# Patient Record
Sex: Female | Born: 1985 | Race: White | Hispanic: No | Marital: Married | Smoking: Current every day smoker
Health system: Southern US, Community
[De-identification: ages and names within clinical notes are randomized; demographics above are authoritative.]

## PROBLEM LIST (undated history)

## (undated) DIAGNOSIS — O26649 Intrahepatic cholestasis of pregnancy, unspecified trimester: Secondary | ICD-10-CM

## (undated) DIAGNOSIS — R87629 Unspecified abnormal cytological findings in specimens from vagina: Secondary | ICD-10-CM

## (undated) DIAGNOSIS — O26619 Liver and biliary tract disorders in pregnancy, unspecified trimester: Secondary | ICD-10-CM

## (undated) DIAGNOSIS — F988 Other specified behavioral and emotional disorders with onset usually occurring in childhood and adolescence: Secondary | ICD-10-CM

## (undated) DIAGNOSIS — K831 Obstruction of bile duct: Secondary | ICD-10-CM

## (undated) DIAGNOSIS — F32A Depression, unspecified: Secondary | ICD-10-CM

## (undated) DIAGNOSIS — F329 Major depressive disorder, single episode, unspecified: Secondary | ICD-10-CM

## (undated) HISTORY — DX: Intrahepatic cholestasis of pregnancy, unspecified trimester: O26.649

## (undated) HISTORY — PX: WISDOM TOOTH EXTRACTION: SHX21

## (undated) HISTORY — DX: Obstruction of bile duct: K83.1

## (undated) HISTORY — DX: Unspecified abnormal cytological findings in specimens from vagina: R87.629

## (undated) HISTORY — DX: Depression, unspecified: F32.A

## (undated) HISTORY — DX: Other specified behavioral and emotional disorders with onset usually occurring in childhood and adolescence: F98.8

## (undated) HISTORY — DX: Major depressive disorder, single episode, unspecified: F32.9

## (undated) HISTORY — DX: Liver and biliary tract disorders in pregnancy, unspecified trimester: O26.619

---

## 2009-12-08 ENCOUNTER — Ambulatory Visit: Payer: Self-pay | Admitting: Internal Medicine

## 2015-01-10 NOTE — L&D Delivery Note (Signed)
Delivery Note At 5:11 AM a viable female was delivered via  (Presentation:DOA ;  ).  APGAR: 3, 7; weight  .  pending Placenta status: ,intact, spontaneous .  Cord: 3VC with the following complications: .  Cord pH: 7.04  Over the last 15 minutes deep variable decels to 80's, lasting up to 40 seconds with contractions. Loss of variability but recovery to baseline between pushing. Head at perineum and continued progress with each push. Given imminent delivery, pt allowed continued pushing attempts.   Anesthesia:  epidural Episiotomy:  none Lacerations: 2nd degree;Perineal Suture Repair: 3.0 vicryl rapide small vaginal laceration, distal to hymenal ring, repaired with single figure of 8 suture Est. Blood Loss (mL):  1200  Placenta delivered within minutes of baby. Code Apgar called and NICU in to evaluated baby.   After placental delivery, brisk bleeding noted. Laceration small and not bleeding after repair. Pitocin started just prior to placental delivery. Fundal massage productive of clots. Uterus palpated firm and below umbilicus but continued bleeding. Cervix evaluated, intact, no lacerations. Placenta evaluated- intact. Methergine given, continued bleeding. Red rubber catheter to drain bladder- 50 cc return.  Manual evaluation of uterus- sterile gloved hand placed into uterus, no significant clot, adequate tone noted. 3g Unasyn ordered. 800 mcg cytotec placed- continued bleeding, 0.25mg  Hemabate IM given. Code hemorrhage called. Labs drawn, 2nd IV started.  Pt would have several minute periods of no bleeding followed by short episodes of brisk bleeding- between different medication administrations. After about 40 minutes bleeding finally slowed to a normal PP trickle. 40 minutes spent post-delivery in critical care time managing PP hemorrhage.   Mom to postpartum.  Baby to Couplet care / Skin to Skin.  Annette Lawson A. 12/01/2015 5:56 AM

## 2015-02-22 ENCOUNTER — Other Ambulatory Visit: Payer: Self-pay

## 2015-02-22 ENCOUNTER — Ambulatory Visit (INDEPENDENT_AMBULATORY_CARE_PROVIDER_SITE_OTHER): Payer: BC Managed Care – PPO | Admitting: Gastroenterology

## 2015-02-22 ENCOUNTER — Encounter: Payer: Self-pay | Admitting: Gastroenterology

## 2015-02-22 VITALS — BP 116/67 | HR 77 | Temp 98.2°F | Ht 66.0 in | Wt 145.0 lb

## 2015-02-22 DIAGNOSIS — F988 Other specified behavioral and emotional disorders with onset usually occurring in childhood and adolescence: Secondary | ICD-10-CM | POA: Insufficient documentation

## 2015-02-22 DIAGNOSIS — R1084 Generalized abdominal pain: Secondary | ICD-10-CM | POA: Diagnosis not present

## 2015-02-22 MED ORDER — HYOSCYAMINE SULFATE 0.125 MG SL SUBL: 0.1250 mg | SUBLINGUAL_TABLET | SUBLINGUAL | Status: DC | PRN

## 2015-02-22 NOTE — Progress Notes (Signed)
Gastroenterology Consultation  Referring Provider:     No ref. provider found Primary Care Physician:  No PCP Per Patient Primary Gastroenterologist:  Dr. Allen Norris     Reason for Consultation:     Abdominal pain        HPI:   Annette Lawson is a 30 y.o. y/o female referred for consultation & management of abdominal pain by Dr. Rayne Du PCP Per Patient.  This patient comes in today with a report of attacks that started as burning in her epigastric area that radiates down to her abdomen and around her flanks. She reports that it feels like there is 100 pounds trying to get out of her abdomen. She was seen by OB who reported no source of her pain being seen. The patient states that the pain has got so bad that she has gone to urgent care with a negative workup. The patient also reports that she had some nausea and vomiting with the pain but thinks it was not due to the cause but as a reaction to the pain. The patient has had 2 of the attacks after eating brisket. She has not noticed any other foods or inciting factors. The patient does report that she has tried anti-inflammatories and Tums without any success. She does report that when she takes Tylenol decreases the pain enough for her to fall asleep. Is no report of any black stools or bloody stools. She also denies any diarrhea or constipation. The attacks happen only a few times a year.  Past Medical History  Diagnosis Date  . ADD (attention deficit disorder)     Past Surgical History  Procedure Laterality Date  . No surgical hx      Prior to Admission medications   Medication Sig Start Date End Date Taking? Authorizing Provider  amphetamine-dextroamphetamine (ADDERALL) 10 MG tablet Take by mouth.   Yes Historical Provider, MD  nicotine (RA NICOTINE) 7 mg/24hr patch Place onto the skin.   Yes Historical Provider, MD  hyoscyamine (LEVSIN/SL) 0.125 MG SL tablet Place 1 tablet (0.125 mg total) under the tongue every 4 (four) hours as needed.  02/22/15   Lucilla Lame, MD  Florin 28 0.25-35 MG-MCG tablet Reported on 02/22/2015 01/07/15   Historical Provider, MD    Family History  Problem Relation Age of Onset  . Hypertension Maternal Grandmother      Social History  Substance Use Topics  . Smoking status: Current Some Day Smoker  . Smokeless tobacco: Never Used  . Alcohol Use: 0.0 oz/week    0 Standard drinks or equivalent per week     Comment: Occasional    Allergies as of 02/22/2015  . (No Known Allergies)    Review of Systems:    All systems reviewed and negative except where noted in HPI.   Physical Exam:  BP 116/67 mmHg  Pulse 77  Temp(Src) 98.2 F (36.8 C) (Oral)  Ht 5\' 6"  (1.676 m)  Wt 145 lb (65.772 kg)  BMI 23.41 kg/m2 No LMP recorded. Psych:  Alert and cooperative. Normal mood and affect. General:   Alert,  Well-developed, well-nourished, pleasant and cooperative in NAD Head:  Normocephalic and atraumatic. Eyes:  Sclera clear, no icterus.   Conjunctiva pink. Ears:  Normal auditory acuity. Nose:  No deformity, discharge, or lesions. Mouth:  No deformity or lesions,oropharynx pink & moist. Neck:  Supple; no masses or thyromegaly. Lungs:  Respirations even and unlabored.  Clear throughout to auscultation.   No wheezes, crackles, or rhonchi.  No acute distress. Heart:  Regular rate and rhythm; no murmurs, clicks, rubs, or gallops. Abdomen:  Normal bowel sounds.  No bruits.  Soft, non-tender and non-distended without masses, hepatosplenomegaly or hernias noted.  No guarding or rebound tenderness.  Negative Carnett sign.   Rectal:  Deferred.  Msk:  Symmetrical without gross deformities.  Good, equal movement & strength bilaterally. Pulses:  Normal pulses noted. Extremities:  No clubbing or edema.  No cyanosis. Neurologic:  Alert and oriented x3;  grossly normal neurologically. Skin:  Intact without significant lesions or rashes.  No jaundice. Lymph Nodes:  No significant cervical adenopathy. Psych:   Alert and cooperative. Normal mood and affect.  Imaging Studies: No results found.  Assessment and Plan:   Annette Lawson is a 30 y.o. y/o female comes in today with a history of attacks of abdominal pain that started in the epigastric area with burning which is not relieved by antacids or anti-inflammatories. The pain then radiates down to the entire upper abdomen and flanks. The patient reports the pain to have been so bad that she went to urgent care. The pain lasts approximately 1-1/2 days. The patient will be given samples of hyoscyamine to be taken sublingually whenever she has the pain to see if the symptoms improve. If they do not the patient may need a CT scan of the abdomen. She will also have a requisition for a lipase to be taken to the lab when she has the abdominal pain to rule out possible acute pancreatitis. The patient has been explained the plan and agrees with it.   Note: This dictation was prepared with Dragon dictation along with smaller phrase technology. Any transcriptional errors that result from this process are unintentional.

## 2015-05-19 LAB — OB RESULTS CONSOLE RPR: RPR: NONREACTIVE

## 2015-05-19 LAB — OB RESULTS CONSOLE ANTIBODY SCREEN: ANTIBODY SCREEN: NEGATIVE

## 2015-05-19 LAB — OB RESULTS CONSOLE RUBELLA ANTIBODY, IGM: Rubella: IMMUNE

## 2015-05-19 LAB — OB RESULTS CONSOLE ABO/RH: RH TYPE: NEGATIVE

## 2015-05-19 LAB — OB RESULTS CONSOLE HIV ANTIBODY (ROUTINE TESTING): HIV: NONREACTIVE

## 2015-05-19 LAB — OB RESULTS CONSOLE HEPATITIS B SURFACE ANTIGEN: Hepatitis B Surface Ag: NEGATIVE

## 2015-05-19 LAB — OB RESULTS CONSOLE GC/CHLAMYDIA
Chlamydia: NEGATIVE
GC PROBE AMP, GENITAL: NEGATIVE

## 2015-11-17 LAB — OB RESULTS CONSOLE GBS: STREP GROUP B AG: POSITIVE

## 2015-11-23 ENCOUNTER — Telehealth (HOSPITAL_COMMUNITY): Payer: Self-pay | Admitting: *Deleted

## 2015-11-23 ENCOUNTER — Encounter (HOSPITAL_COMMUNITY): Payer: Self-pay | Admitting: *Deleted

## 2015-11-23 NOTE — Telephone Encounter (Signed)
Preadmission screen  

## 2015-11-25 ENCOUNTER — Other Ambulatory Visit: Payer: Self-pay | Admitting: Obstetrics & Gynecology

## 2015-11-30 ENCOUNTER — Inpatient Hospital Stay (HOSPITAL_COMMUNITY): Payer: BC Managed Care – PPO | Admitting: Anesthesiology

## 2015-11-30 ENCOUNTER — Encounter (HOSPITAL_COMMUNITY): Payer: Self-pay

## 2015-11-30 ENCOUNTER — Inpatient Hospital Stay (HOSPITAL_COMMUNITY)
Admission: RE | Admit: 2015-11-30 | Discharge: 2015-12-03 | DRG: 774 | Disposition: A | Discharge status: Home / Self Care | Payer: BC Managed Care – PPO | Source: Ambulatory Visit | Attending: Obstetrics & Gynecology | Admitting: Obstetrics & Gynecology

## 2015-11-30 DIAGNOSIS — O9081 Anemia of the puerperium: Secondary | ICD-10-CM | POA: Diagnosis not present

## 2015-11-30 DIAGNOSIS — O2662 Liver and biliary tract disorders in childbirth: Secondary | ICD-10-CM | POA: Diagnosis present

## 2015-11-30 DIAGNOSIS — O99334 Smoking (tobacco) complicating childbirth: Secondary | ICD-10-CM | POA: Diagnosis present

## 2015-11-30 DIAGNOSIS — D62 Acute posthemorrhagic anemia: Secondary | ICD-10-CM | POA: Diagnosis not present

## 2015-11-30 DIAGNOSIS — F17200 Nicotine dependence, unspecified, uncomplicated: Secondary | ICD-10-CM | POA: Diagnosis present

## 2015-11-30 DIAGNOSIS — F329 Major depressive disorder, single episode, unspecified: Secondary | ICD-10-CM | POA: Diagnosis present

## 2015-11-30 DIAGNOSIS — O99824 Streptococcus B carrier state complicating childbirth: Secondary | ICD-10-CM | POA: Diagnosis present

## 2015-11-30 DIAGNOSIS — K831 Obstruction of bile duct: Secondary | ICD-10-CM | POA: Diagnosis present

## 2015-11-30 DIAGNOSIS — O99344 Other mental disorders complicating childbirth: Secondary | ICD-10-CM | POA: Diagnosis present

## 2015-11-30 DIAGNOSIS — Z3A37 37 weeks gestation of pregnancy: Secondary | ICD-10-CM

## 2015-11-30 LAB — CBC
HCT: 31.6 % — ABNORMAL LOW (ref 36.0–46.0)
HCT: 29.8 % — ABNORMAL LOW (ref 36.0–46.0)
HEMOGLOBIN: 10.5 g/dL — AB (ref 12.0–15.0)
Hemoglobin: 10 g/dL — ABNORMAL LOW (ref 12.0–15.0)
MCH: 27.5 pg (ref 26.0–34.0)
MCH: 28.2 pg (ref 26.0–34.0)
MCHC: 33.2 g/dL (ref 30.0–36.0)
MCHC: 33.6 g/dL (ref 30.0–36.0)
MCV: 82.7 fL (ref 78.0–100.0)
MCV: 83.9 fL (ref 78.0–100.0)
PLATELETS: 310 10*3/uL (ref 150–400)
Platelets: 347 10*3/uL (ref 150–400)
RBC: 3.82 MIL/uL — AB (ref 3.87–5.11)
RBC: 3.55 MIL/uL — ABNORMAL LOW (ref 3.87–5.11)
RDW: 14.1 % (ref 11.5–15.5)
RDW: 14.1 % (ref 11.5–15.5)
WBC: 10.7 10*3/uL — ABNORMAL HIGH (ref 4.0–10.5)
WBC: 14.2 10*3/uL — AB (ref 4.0–10.5)

## 2015-11-30 LAB — COMPREHENSIVE METABOLIC PANEL
ALBUMIN: 2.9 g/dL — AB (ref 3.5–5.0)
ALT: 14 U/L (ref 14–54)
ANION GAP: 9 (ref 5–15)
AST: 24 U/L (ref 15–41)
Alkaline Phosphatase: 195 U/L — ABNORMAL HIGH (ref 38–126)
BILIRUBIN TOTAL: 0.4 mg/dL (ref 0.3–1.2)
BUN: 9 mg/dL (ref 6–20)
CHLORIDE: 104 mmol/L (ref 101–111)
CO2: 21 mmol/L — ABNORMAL LOW (ref 22–32)
Calcium: 9.4 mg/dL (ref 8.9–10.3)
Creatinine, Ser: 0.64 mg/dL (ref 0.44–1.00)
GFR calc Af Amer: >60 mL/min (ref >60)
GLUCOSE: 85 mg/dL (ref 65–99)
POTASSIUM: 4 mmol/L (ref 3.5–5.1)
Sodium: 134 mmol/L — ABNORMAL LOW (ref 135–145)
TOTAL PROTEIN: 6.2 g/dL — AB (ref 6.5–8.1)

## 2015-11-30 LAB — RPR: RPR Ser Ql: NONREACTIVE

## 2015-11-30 MED ORDER — PHENYLEPHRINE 40 MCG/ML (10ML) SYRINGE FOR IV PUSH (FOR BLOOD PRESSURE SUPPORT)
80.0000 ug | PREFILLED_SYRINGE | INTRAVENOUS | Status: DC | PRN
  Filled 2015-11-30: qty 10
  Filled 2015-11-30: qty 5

## 2015-11-30 MED ORDER — LACTATED RINGERS IV SOLN: 500.0000 mL | Freq: Once | INTRAVENOUS | Status: DC

## 2015-11-30 MED ORDER — SOD CITRATE-CITRIC ACID 500-334 MG/5ML PO SOLN
30.0000 mL | ORAL | Status: DC | PRN
  Filled 2015-11-30: qty 15

## 2015-11-30 MED ORDER — LACTATED RINGERS IV SOLN: 500.0000 mL | INTRAVENOUS | Status: DC | PRN

## 2015-11-30 MED ORDER — PHENYLEPHRINE 40 MCG/ML (10ML) SYRINGE FOR IV PUSH (FOR BLOOD PRESSURE SUPPORT)
80.0000 ug | PREFILLED_SYRINGE | INTRAVENOUS | Status: DC | PRN
  Filled 2015-11-30: qty 5

## 2015-11-30 MED ORDER — EPHEDRINE 5 MG/ML INJ
10.0000 mg | INTRAVENOUS | Status: DC | PRN
  Filled 2015-11-30: qty 4

## 2015-11-30 MED ORDER — ONDANSETRON HCL 4 MG/2ML IJ SOLN: 4.0000 mg | Freq: Four times a day (QID) | INTRAMUSCULAR | Status: DC | PRN

## 2015-11-30 MED ORDER — DEXTROSE 5 % IV SOLN
2.5000 10*6.[IU] | INTRAVENOUS | Status: DC
  Filled 2015-11-30: qty 2.5

## 2015-11-30 MED ORDER — FENTANYL 2.5 MCG/ML BUPIVACAINE 1/10 % EPIDURAL INFUSION (WH - ANES)
14.0000 mL/h | INTRAMUSCULAR | Status: DC | PRN
  Administered 2015-11-30: 14 mL/h via EPIDURAL
  Filled 2015-11-30 (×2): qty 100

## 2015-11-30 MED ORDER — TERBUTALINE SULFATE 1 MG/ML IJ SOLN
0.2500 mg | Freq: Once | INTRAMUSCULAR | Status: DC | PRN
  Filled 2015-11-30: qty 1

## 2015-11-30 MED ORDER — PENICILLIN G POT IN DEXTROSE 60000 UNIT/ML IV SOLN
3.0000 10*6.[IU] | INTRAVENOUS | Status: DC
  Administered 2015-11-30 – 2015-12-01 (×4): 3 10*6.[IU] via INTRAVENOUS
  Filled 2015-11-30 (×7): qty 50

## 2015-11-30 MED ORDER — EPHEDRINE 5 MG/ML INJ: 10.0000 mg | INTRAVENOUS | Status: DC | PRN

## 2015-11-30 MED ORDER — PHENYLEPHRINE 40 MCG/ML (10ML) SYRINGE FOR IV PUSH (FOR BLOOD PRESSURE SUPPORT): 80.0000 ug | PREFILLED_SYRINGE | INTRAVENOUS | Status: DC | PRN

## 2015-11-30 MED ORDER — LACTATED RINGERS IV SOLN
INTRAVENOUS | Status: DC
  Administered 2015-11-30 (×2): via INTRAVENOUS

## 2015-11-30 MED ORDER — OXYTOCIN BOLUS FROM INFUSION: 500.0000 mL | Freq: Once | INTRAVENOUS | Status: DC

## 2015-11-30 MED ORDER — LIDOCAINE HCL (PF) 1 % IJ SOLN
30.0000 mL | INTRAMUSCULAR | Status: AC | PRN
Start: 2015-11-30 — End: 2015-12-03
  Administered 2015-12-01: 30 mL via SUBCUTANEOUS
  Filled 2015-11-30: qty 30

## 2015-11-30 MED ORDER — OXYTOCIN 40 UNITS IN LACTATED RINGERS INFUSION - SIMPLE MED
1.0000 m[IU]/min | INTRAVENOUS | Status: DC
  Administered 2015-11-30: 2 m[IU]/min via INTRAVENOUS

## 2015-11-30 MED ORDER — BUTORPHANOL TARTRATE 1 MG/ML IJ SOLN
1.0000 mg | INTRAMUSCULAR | Status: DC | PRN
  Administered 2015-11-30: 1 mg via INTRAVENOUS
  Filled 2015-11-30: qty 1

## 2015-11-30 MED ORDER — PENICILLIN G POTASSIUM 5000000 UNITS IJ SOLR
5.0000 10*6.[IU] | Freq: Once | INTRAVENOUS | Status: AC
  Administered 2015-11-30: 5 10*6.[IU] via INTRAVENOUS
  Filled 2015-11-30: qty 5

## 2015-11-30 MED ORDER — OXYTOCIN 40 UNITS IN LACTATED RINGERS INFUSION - SIMPLE MED
2.5000 [IU]/h | INTRAVENOUS | Status: DC
  Filled 2015-11-30: qty 1000

## 2015-11-30 MED ORDER — LIDOCAINE HCL (PF) 1 % IJ SOLN
INTRAMUSCULAR | Status: DC | PRN
  Administered 2015-11-30: 4 mL via EPIDURAL

## 2015-11-30 MED ORDER — DIPHENHYDRAMINE HCL 50 MG/ML IJ SOLN: 12.5000 mg | INTRAMUSCULAR | Status: DC | PRN

## 2015-11-30 MED ORDER — OXYCODONE-ACETAMINOPHEN 5-325 MG PO TABS: 1.0000 | ORAL_TABLET | ORAL | Status: DC | PRN

## 2015-11-30 MED ORDER — ACETAMINOPHEN 325 MG PO TABS
650.0000 mg | ORAL_TABLET | ORAL | Status: DC | PRN
  Administered 2015-11-30: 650 mg via ORAL
  Filled 2015-11-30: qty 2

## 2015-11-30 MED ORDER — OXYCODONE-ACETAMINOPHEN 5-325 MG PO TABS: 2.0000 | ORAL_TABLET | ORAL | Status: DC | PRN

## 2015-11-30 MED ORDER — FLEET ENEMA 7-19 GM/118ML RE ENEM: 1.0000 | ENEMA | RECTAL | Status: DC | PRN

## 2015-11-30 NOTE — Progress Notes (Signed)
Dr Pamala Hurry attempted foley bulb placement.  Placement successful with 52F foley and 6+0cc balloon.  300cc blood loss noted from foley after insertion. Dr Pamala Hurry remained at bedside and foley immediately removed with resolution of bleeding with removal

## 2015-11-30 NOTE — Anesthesia Preprocedure Evaluation (Signed)
Anesthesia Evaluation  Patient identified by MRN, date of birth, ID band Patient awake    Reviewed: Allergy & Precautions, Patient's Chart, lab work & pertinent test results  Airway Mallampati: II       Dental  (+) Teeth Intact   Pulmonary Current Smoker,    breath sounds clear to auscultation       Cardiovascular negative cardio ROS   Rhythm:Regular Rate:Normal     Neuro/Psych PSYCHIATRIC DISORDERS Depression negative neurological ROS     GI/Hepatic negative GI ROS, Neg liver ROS,   Endo/Other  negative endocrine ROS  Renal/GU negative Renal ROS  negative genitourinary   Musculoskeletal negative musculoskeletal ROS (+)   Abdominal   Peds negative pediatric ROS (+)  Hematology negative hematology ROS (+)   Anesthesia Other Findings   Reproductive/Obstetrics (+) Pregnancy                             Lab Results  Component Value Date   WBC 14.2 (H) 11/30/2015   HGB 10.0 (L) 11/30/2015   HCT 29.8 (L) 11/30/2015   MCV 83.9 11/30/2015   PLT 310 11/30/2015   No results found for: INR, PROTIME   Anesthesia Physical Anesthesia Plan  ASA: II  Anesthesia Plan: Epidural   Post-op Pain Management:    Induction:   Airway Management Planned:   Additional Equipment:   Intra-op Plan:   Post-operative Plan:   Informed Consent: I have reviewed the patients History and Physical, chart, labs and discussed the procedure including the risks, benefits and alternatives for the proposed anesthesia with the patient or authorized representative who has indicated his/her understanding and acceptance.     Plan Discussed with:   Anesthesia Plan Comments:         Anesthesia Quick Evaluation

## 2015-11-30 NOTE — H&P (Signed)
Annette Lawson is a 30 y.o. G1P0 at [redacted]w[redacted]d presenting for IOL due to cholestasis of pregnancy. Pt notes no contractions.  . Good fetal movement, No vaginal bleeding, not leaking fluid. Pt notes itching persists despite ursodiol. Occasional HA (h/o chronic HA), no scotomata, no RUQ pain.   [facility] at [place] since 9 wks - Dated by LMP c/w 9 wk u/s - O neg, s/p Rhogam - condyloma excised during pregnancy - Depression, started on Zoloft, 50mg  at 25 wks - Anemia - GBS positive - cholestasis, diagnosed at 35 wks after w/u for itching revealed bile acid salts of 15, no evidence PEC   Prenatal Transfer Tool  Maternal Diabetes: No Genetic Screening: Normal Maternal Ultrasounds/Referrals: Normal Fetal Ultrasounds or other Referrals:  None Maternal Substance Abuse:  No Significant Maternal Medications:  None Significant Maternal Lab Results: None     OB History    Gravida Para Term Preterm AB Living   1             SAB TAB Ectopic Multiple Live Births                 Past Medical History:  Diagnosis Date  . ADD (attention deficit disorder)   . Cholestasis during pregnancy   . Depression   . Vaginal Pap smear, abnormal    Past Surgical History:  Procedure Laterality Date  . No surgical hx    . WISDOM TOOTH EXTRACTION     Family History: family history includes COPD in her maternal grandmother. Social History:  reports that she has been smoking.  She has never used smokeless tobacco. She reports that she drinks alcohol. She reports that she does not use drugs.  Review of Systems - Negative except itcing   Dilation: 1.5 Effacement (%): 80 Station: -2 Exam by:: sowder Blood pressure 140/83, pulse 73, temperature 97.6 F (36.4 C), temperature source Oral, resp. rate 20, height 5\' 6"  (1.676 m), weight 94.3 kg (208 lb).  Physical Exam:  Gen: well appearing, no distress Back: no CVAT Abd: gravid, NT, no RUQ pain LE: no edema, equal bilaterally, non-tender Toco:  irritability/ q 2 min, pitocin at 14 munits/ min FH: baseline 150s, accelerations present, no deceleratons, 10 beat variability  Prenatal labs: ABO, Rh: --/--/O NEG (11/21 0750) Antibody: POS (11/21 0750) Rubella: !Error!  Immune RPR: Nonreactive (05/10 0000)  HBsAg: Negative (05/10 0000)  HIV: Non-reactive (05/10 0000)  GBS: Positive (11/08 0000)  1 hr Glucola 113  Genetic screening nl NT, nl AFP Anatomy US normal  CBC    Component Value Date/Time   WBC 10.7 (H) 11/30/2015 0750   RBC 3.82 (L) 11/30/2015 0750   HGB 10.5 (L) 11/30/2015 0750   HCT 31.6 (L) 11/30/2015 0750   PLT 347 11/30/2015 0750   MCV 82.7 11/30/2015 0750   MCH 27.5 11/30/2015 0750   MCHC 33.2 11/30/2015 0750   RDW 14.1 11/30/2015 0750   CMP     Component Value Date/Time   NA 134 (L) 11/30/2015 0750   K 4.0 11/30/2015 0750   CL 104 11/30/2015 0750   CO2 21 (L) 11/30/2015 0750   GLUCOSE 85 11/30/2015 0750   BUN 9 11/30/2015 0750   CREATININE 0.64 11/30/2015 0750   CALCIUM 9.4 11/30/2015 0750   PROT 6.2 (L) 11/30/2015 0750   ALBUMIN 2.9 (L) 11/30/2015 0750   AST 24 11/30/2015 0750   ALT 14 11/30/2015 0750   ALKPHOS 195 (H) 11/30/2015 0750   BILITOT 0.4 11/30/2015 0750  GFRNONAA >60 11/30/2015 0750   GFRAA >60 11/30/2015 0750      Assessment/Plan: 30 y.o. G1P0 at [redacted]w[redacted]d IOL for cholestasis of pregancy, no evidence PEC - GBS pos,. Plan PCN - Depression, continue Zoloft - O neg, plan Rhogam PP - Anemia.   - reactive   Fetal testing - planning epidural - IOL. Pt has been on pitocin since am, no significant cervical change. Decision made to proceed with foley bulb placement.  Foley bulb: cervical exam done, 1cm, 50%, mid/ medium. Decision to place foley and indications R/B d/w pt. Pt placed in stirrups. Sterile speculum placed into vagina. Betadine x 3 to wipe cvx and vagina. Foley balloon easily threaded through cervix just past internal os, balloon filled with 300cc NS. Brisk vaginal bleeding  noted- 300cc bright red blood coming through foley port. Reactive fetal monitoring, pt w/o pain. Placement checked. Continued bleeding, Decision made to remove foley bulb, bleeding stopped. Vertex palpated, membranes intact. D/w pt more than expected bleeding with foley placement, unclear etiology. Continued attempt at IOL and fetal monitoring.   Annette Lawson A. 11/30/2015, 3:27 PM

## 2015-11-30 NOTE — Anesthesia Pain Management Evaluation Note (Signed)
  CRNA Pain Management Visit Note  Patient: Annette Lawson, 30 y.o., female  "Hello I am a member of the anesthesia team at [facility]. We have an anesthesia team available at all times to provide care throughout the hospital, including epidural management and anesthesia for C-section. I don't know your plan for the delivery whether it a natural birth, water birth, IV sedation, nitrous supplementation, doula or epidural, but we want to meet your pain goals."   1.Was your pain managed to your expectations on prior hospitalizations?   No prior hospitalizations  2.What is your expectation for pain management during this hospitalization?     Epidural  3.How can we help you reach that goal? Epidural before it gets too late.  Record the patient's initial score and the patient's pain goal.   Pain: 0  Pain Goal: 7 [facility] wants you to be able to say your pain was always managed very well.  Hodari Chuba 11/30/2015

## 2015-11-30 NOTE — Progress Notes (Signed)
S: Doing well, no complaints, pain well controlled with epidural. Just placed now as contraction pain worsenign despite IV pain meds.   O: BP 112/70   Pulse 86   Temp 98.3 F (36.8 C) (Oral)   Resp 17   Ht 5\' 6"  (1.676 m)   Wt 94.3 kg (208 lb)   BMI 33.57 kg/m    FHT:  FHR: 140s bpm, variability: moderate,  accelerations:  Present,  decelerations:  Absent UC:   regular, every 3 minutes SVE:   Dilation: 1.5 Effacement (%): 50 Station: -2 Exam by:: Merton Border, RN Recheck by me: 3/80%/ mid position/ soft consistency/ vt x-2. Cervical scar released with digital exam  A / P:  30 y.o.  Obstetric History   G1   P0   T0   P0   A0   L0    SAB0   TAB0   Ectopic0   Multiple0   Live Births0    at [redacted]w[redacted]d IOL cholestasis of pregnancy, slow progress, just now getting active. IUPC placed to better titrate pitocin  Fetal Wellbeing:  Category I Pain Control:  Epidural  Anticipated MOD:  NSVD  Frimy Uffelman A. 11/30/2015, 11:01 PM

## 2015-11-30 NOTE — Progress Notes (Signed)
S: Doing well, no complaints, pain well controlled as only feeling cramping. No further bleeding.   O: BP 139/79   Pulse 71   Temp 97.8 F (36.6 C) (Oral)   Resp 20   Ht 5\' 6"  (1.676 m)   Wt 94.3 kg (208 lb)   BMI 33.57 kg/m    FHT:  FHR: 140s bpm, variability: moderate,  accelerations:  Present,  decelerations:  Absent UC:   regular, every 3 minutes- though pt not uncomfortble, pitocin at 14 munits/ min SVE:   Dilation: 1.5 Effacement (%): 50 Station: -2 Exam by:: Nissi Doffing  AROM- bloody fluid    A / P:  30 y.o.  Obstetric History   G1   P0   T0   P0   A0   L0    SAB0   TAB0   Ectopic0   Multiple0   Live Births0    at [redacted]w[redacted]d IOL due to cholestasis of pregnancy, slow progress, not yet in active labor, pitocin has not been increased for many hours due to irritability. Encouraged nurse to increase pitocin as fetal testing reactive. AROM now.   Fetal Wellbeing:  Category I Pain Control:  Labor support without medications  Anticipated MOD:  NSVD though needs to get into active labor.   Annette Lawson A. 11/30/2015, 7:09 PM

## 2015-11-30 NOTE — Anesthesia Procedure Notes (Signed)
Epidural Patient location during procedure: OB Start time: 11/30/2015 10:14 PM End time: 11/30/2015 10:21 PM  Staffing Anesthesiologist: Suella Broad D Performed: anesthesiologist   Preanesthetic Checklist Completed: patient identified, site marked, surgical consent, pre-op evaluation, timeout performed, IV checked, risks and benefits discussed and monitors and equipment checked  Epidural Patient position: sitting Prep: ChloraPrep Patient monitoring: heart rate, continuous pulse ox and blood pressure Approach: midline Location: L3-L4 Injection technique: LOR saline  Needle:  Needle type: Tuohy  Needle gauge: 17 G Needle length: 9 cm Catheter type: closed end flexible Catheter size: 20 Guage Test dose: negative and 1.5% lidocaine  Assessment Events: blood not aspirated, injection not painful, no injection resistance and no paresthesia  Additional Notes LOR @ 5.5  Patient identified. Risks/Benefits/Options discussed with patient including but not limited to bleeding, infection, nerve damage, paralysis, failed block, incomplete pain control, headache, blood pressure changes, nausea, vomiting, reactions to medications, itching and postpartum back pain. Confirmed with bedside nurse the patient's most recent platelet count. Confirmed with patient that they are not currently taking any anticoagulation, have any bleeding history or any family history of bleeding disorders. Patient expressed understanding and wished to proceed. All questions were answered. Sterile technique was used throughout the entire procedure. Please see nursing notes for vital signs. Test dose was given through epidural catheter and negative prior to continuing to dose epidural or start infusion. Warning signs of high block given to the patient including shortness of breath, tingling/numbness in hands, complete motor block, or any concerning symptoms with instructions to call for help. Patient was given instructions  on fall risk and not to get out of bed. All questions and concerns addressed with instructions to call with any issues or inadequate analgesia.    Reason for block:procedure for pain

## 2015-12-01 ENCOUNTER — Encounter (HOSPITAL_COMMUNITY): Payer: Self-pay

## 2015-12-01 LAB — CBC
HCT: 28.4 % — ABNORMAL LOW (ref 36.0–46.0)
HEMATOCRIT: 23.6 % — AB (ref 36.0–46.0)
HEMOGLOBIN: 9.4 g/dL — AB (ref 12.0–15.0)
HEMOGLOBIN: 8 g/dL — AB (ref 12.0–15.0)
MCH: 27.6 pg (ref 26.0–34.0)
MCH: 28.2 pg (ref 26.0–34.0)
MCHC: 33.1 g/dL (ref 30.0–36.0)
MCHC: 33.9 g/dL (ref 30.0–36.0)
MCV: 83.3 fL (ref 78.0–100.0)
MCV: 83.1 fL (ref 78.0–100.0)
Platelets: 278 10*3/uL (ref 150–400)
Platelets: 222 10*3/uL (ref 150–400)
RBC: 3.41 MIL/uL — ABNORMAL LOW (ref 3.87–5.11)
RBC: 2.84 MIL/uL — AB (ref 3.87–5.11)
RDW: 14.4 % (ref 11.5–15.5)
RDW: 14.3 % (ref 11.5–15.5)
WBC: 16.4 10*3/uL — AB (ref 4.0–10.5)
WBC: 24.1 10*3/uL — ABNORMAL HIGH (ref 4.0–10.5)

## 2015-12-01 LAB — PROTIME-INR
INR: 1.01
PROTHROMBIN TIME: 13.3 s (ref 11.4–15.2)

## 2015-12-01 LAB — APTT: aPTT: 25 seconds (ref 24–36)

## 2015-12-01 LAB — BILE ACIDS, TOTAL: BILE ACIDS TOTAL: 9.2 umol/L (ref 4.7–24.5)

## 2015-12-01 LAB — SAVE SMEAR

## 2015-12-01 LAB — FIBRINOGEN: Fibrinogen: 405 mg/dL (ref 210–475)

## 2015-12-01 MED ORDER — SENNOSIDES-DOCUSATE SODIUM 8.6-50 MG PO TABS
2.0000 | ORAL_TABLET | ORAL | Status: DC
  Administered 2015-12-02: 2 via ORAL
  Filled 2015-12-01: qty 2

## 2015-12-01 MED ORDER — BENZOCAINE-MENTHOL 20-0.5 % EX AERO
1.0000 "application " | INHALATION_SPRAY | CUTANEOUS | Status: DC | PRN
  Administered 2015-12-01: 1 via TOPICAL
  Filled 2015-12-01: qty 56

## 2015-12-01 MED ORDER — SODIUM CHLORIDE 0.9 % IV SOLN
3.0000 g | Freq: Four times a day (QID) | INTRAVENOUS | Status: DC
  Administered 2015-12-01: 3 g via INTRAVENOUS
  Filled 2015-12-01 (×2): qty 3

## 2015-12-01 MED ORDER — TETANUS-DIPHTH-ACELL PERTUSSIS 5-2.5-18.5 LF-MCG/0.5 IM SUSP: 0.5000 mL | Freq: Once | INTRAMUSCULAR | Status: DC

## 2015-12-01 MED ORDER — OXYCODONE HCL 5 MG PO TABS
10.0000 mg | ORAL_TABLET | ORAL | Status: DC | PRN
  Administered 2015-12-01 (×2): 10 mg via ORAL
  Filled 2015-12-01 (×2): qty 2

## 2015-12-01 MED ORDER — WITCH HAZEL-GLYCERIN EX PADS: 1.0000 "application " | MEDICATED_PAD | CUTANEOUS | Status: DC | PRN

## 2015-12-01 MED ORDER — TERBUTALINE SULFATE 1 MG/ML IJ SOLN
0.2500 mg | Freq: Once | INTRAMUSCULAR | Status: DC | PRN
  Filled 2015-12-01: qty 1

## 2015-12-01 MED ORDER — OXYTOCIN 40 UNITS IN LACTATED RINGERS INFUSION - SIMPLE MED: 1.0000 m[IU]/min | INTRAVENOUS | Status: DC

## 2015-12-01 MED ORDER — METHYLERGONOVINE MALEATE 0.2 MG/ML IJ SOLN
0.2000 mg | INTRAMUSCULAR | Status: DC | PRN
Start: 2015-12-01 — End: 2015-12-03

## 2015-12-01 MED ORDER — METHYLERGONOVINE MALEATE 0.2 MG/ML IJ SOLN
INTRAMUSCULAR | Status: AC
  Filled 2015-12-01: qty 1

## 2015-12-01 MED ORDER — LACTATED RINGERS IV BOLUS (SEPSIS): 1000.0000 mL | Freq: Once | INTRAVENOUS | Status: DC

## 2015-12-01 MED ORDER — IBUPROFEN 600 MG PO TABS
600.0000 mg | ORAL_TABLET | Freq: Four times a day (QID) | ORAL | Status: DC
  Administered 2015-12-01 – 2015-12-03 (×9): 600 mg via ORAL
  Filled 2015-12-01 (×9): qty 1

## 2015-12-01 MED ORDER — LOPERAMIDE HCL 2 MG PO CAPS
2.0000 mg | ORAL_CAPSULE | ORAL | Status: DC | PRN
Start: 2015-12-01 — End: 2015-12-03
  Administered 2015-12-01: 2 mg via ORAL
  Filled 2015-12-01 (×2): qty 1

## 2015-12-01 MED ORDER — ZOLPIDEM TARTRATE 5 MG PO TABS: 5.0000 mg | ORAL_TABLET | Freq: Every evening | ORAL | Status: DC | PRN

## 2015-12-01 MED ORDER — MISOPROSTOL 200 MCG PO TABS
ORAL_TABLET | ORAL | Status: AC
  Filled 2015-12-01: qty 1

## 2015-12-01 MED ORDER — DIBUCAINE 1 % RE OINT: 1.0000 "application " | TOPICAL_OINTMENT | RECTAL | Status: DC | PRN

## 2015-12-01 MED ORDER — CARBOPROST TROMETHAMINE 250 MCG/ML IM SOLN: 250.0000 ug | Freq: Once | INTRAMUSCULAR | Status: DC

## 2015-12-01 MED ORDER — MISOPROSTOL 200 MCG PO TABS
800.0000 ug | ORAL_TABLET | Freq: Once | ORAL | Status: AC
  Administered 2015-12-01: 800 ug via RECTAL

## 2015-12-01 MED ORDER — ONDANSETRON HCL 4 MG PO TABS: 4.0000 mg | ORAL_TABLET | ORAL | Status: DC | PRN

## 2015-12-01 MED ORDER — ONDANSETRON HCL 4 MG/2ML IJ SOLN: 4.0000 mg | INTRAMUSCULAR | Status: DC | PRN

## 2015-12-01 MED ORDER — DIPHENHYDRAMINE HCL 25 MG PO CAPS: 25.0000 mg | ORAL_CAPSULE | Freq: Four times a day (QID) | ORAL | Status: DC | PRN

## 2015-12-01 MED ORDER — METHYLERGONOVINE MALEATE 0.2 MG/ML IJ SOLN
0.2000 mg | Freq: Once | INTRAMUSCULAR | Status: AC
  Administered 2015-12-01: 0.2 mg via INTRAMUSCULAR

## 2015-12-01 MED ORDER — URSODIOL 300 MG PO CAPS
300.0000 mg | ORAL_CAPSULE | Freq: Three times a day (TID) | ORAL | Status: DC
  Administered 2015-12-01 – 2015-12-03 (×7): 300 mg via ORAL
  Filled 2015-12-01 (×10): qty 1

## 2015-12-01 MED ORDER — OXYCODONE HCL 5 MG PO TABS
5.0000 mg | ORAL_TABLET | ORAL | Status: DC | PRN
  Administered 2015-12-01 – 2015-12-02 (×2): 5 mg via ORAL
  Filled 2015-12-01 (×2): qty 1

## 2015-12-01 MED ORDER — ACETAMINOPHEN 325 MG PO TABS
650.0000 mg | ORAL_TABLET | ORAL | Status: DC | PRN
  Administered 2015-12-01 – 2015-12-03 (×2): 650 mg via ORAL
  Filled 2015-12-01 (×2): qty 2

## 2015-12-01 MED ORDER — MISOPROSTOL 200 MCG PO TABS
ORAL_TABLET | ORAL | Status: AC
  Filled 2015-12-01: qty 5

## 2015-12-01 MED ORDER — CARBOPROST TROMETHAMINE 250 MCG/ML IM SOLN
INTRAMUSCULAR | Status: AC
  Filled 2015-12-01: qty 1

## 2015-12-01 MED ORDER — METHYLERGONOVINE MALEATE 0.2 MG PO TABS
0.2000 mg | ORAL_TABLET | ORAL | Status: DC | PRN
Start: 2015-12-01 — End: 2015-12-03

## 2015-12-01 MED ORDER — SERTRALINE HCL 100 MG PO TABS
100.0000 mg | ORAL_TABLET | Freq: Every day | ORAL | Status: DC
  Administered 2015-12-01 – 2015-12-02 (×2): 100 mg via ORAL
  Filled 2015-12-01 (×4): qty 1

## 2015-12-01 MED ORDER — COCONUT OIL OIL
1.0000 "application " | TOPICAL_OIL | Status: DC | PRN
  Administered 2015-12-03: 1 via TOPICAL
  Filled 2015-12-01: qty 120

## 2015-12-01 MED ORDER — SIMETHICONE 80 MG PO CHEW: 80.0000 mg | CHEWABLE_TABLET | ORAL | Status: DC | PRN

## 2015-12-01 MED ORDER — PRENATAL MULTIVITAMIN CH
1.0000 | ORAL_TABLET | Freq: Every day | ORAL | Status: DC
  Administered 2015-12-01 – 2015-12-03 (×3): 1 via ORAL
  Filled 2015-12-01 (×3): qty 1

## 2015-12-01 NOTE — Anesthesia Postprocedure Evaluation (Signed)
Anesthesia Post Note  Patient: Annette Lawson  Procedure(s) Performed: * No procedures listed *  Patient location during evaluation: Mother Baby Anesthesia Type: Epidural Level of consciousness: awake and alert Pain management: satisfactory to patient Vital Signs Assessment: post-procedure vital signs reviewed and stable Respiratory status: respiratory function stable Cardiovascular status: stable Postop Assessment: no headache, no backache, epidural receding, patient able to bend at knees, no signs of nausea or vomiting and adequate PO intake Anesthetic complications: no     Last Vitals:  Vitals:   12/01/15 1206 12/01/15 1647  BP: (!) 112/59   Pulse: 79   Resp: 18   Temp: 36.8 C 37.1 C    Last Pain:  Vitals:   12/01/15 1647  TempSrc: Axillary  PainSc:    Pain Goal: Patients Stated Pain Goal: 3 (12/01/15 1546)               Katherina Mires

## 2015-12-01 NOTE — Progress Notes (Signed)
MOB was referred for history of depression/anxiety. * Referral screened out by Clinical Social Worker because none of the following criteria appear to apply: ~ History of anxiety/depression during this pregnancy, or of post-partum depression. ~ Diagnosis of anxiety and/or depression within last 3 years OR * MOB's symptoms currently being treated with medication and/or therapy. Please contact the Clinical Social Worker if needs arise, or if MOB requests.  Diamantina Edinger Boyd-Gilyard, MSW, LCSW Clinical Social Work [phone] 

## 2015-12-01 NOTE — Lactation Note (Signed)
This note was copied from a baby's chart. Lactation Consultation Note  Patient Name: Annette Lawson Today's Date: 12/01/2015 Reason for consult: Initial assessment Baby is 74 hours old and has been to the breast x2  Prior to to this consult per mom and doc flow sheets.  LC checked diaper - noted to be dry. LC placed baby to the breast on the right side , STS , and assisted  Mom with latch with depth and noted intermittent chewing. Also worked on depth with few light swallows.  Baby stayed latched for 5 mins and released, acting like she was down. LC tried to re- latch, and baby  Didn't act interested. Badger laid baby on moms chest STS , with 2 baby blankets on top.  LC explained to mom and dad their [name] "Annette Lawson" is a 63 4/7 week infant and also due to mom doing  Extra bleeding with delivery at some point during her stay it would be important to post pump.  Mom aware the DEBP will be set up after she gets some rest.  Mother informed of post-discharge support and given phone number to the lactation department, including services for phone call assistance; out-patient appointments; and breastfeeding support group. List of other breastfeeding resources in the community given in the handout. Encouraged mother to call for problems or concerns related to breastfeeding.  Maternal Data Has patient been taught Hand Expression?: Yes (several large drops, ) Does the patient have breastfeeding experience prior to this delivery?: No  Feeding Feeding Type: Breast Fed Length of feed: 5 min  LATCH Score/Interventions Latch: Repeated attempts needed to sustain latch, nipple held in mouth throughout feeding, stimulation needed to elicit sucking reflex. Intervention(s): Adjust position;Assist with latch;Breast massage  Audible Swallowing: A few with stimulation  Type of Nipple: Everted at rest and after stimulation  Comfort (Breast/Nipple): Soft / non-tender     Hold (Positioning):  Assistance needed to correctly position infant at breast and maintain latch. Intervention(s): Breastfeeding basics reviewed;Support Pillows;Position options;Skin to skin  LATCH Score: 7  Lactation Tools Discussed/Used WIC Program: No   Consult Status Consult Status: Follow-up Date: 12/01/15 Follow-up type: In-patient    [place] 12/01/2015, 12:52 PM

## 2015-12-01 NOTE — Progress Notes (Signed)
S: Doing well, no complaints, pain well controlled with epidural  O: BP 115/66   Pulse 77   Temp 98.5 F (36.9 C) (Oral)   Resp 16   Ht 5\' 6"  (1.676 m)   Wt 94.3 kg (208 lb)   BMI 33.57 kg/m    FHT:  FHR: 145s bpm, variability: moderate,  accelerations:  Present,  decelerations:  Absent UC:   regular, every 2-3 minutes SVE:   Dilation: 4 Effacement (%): 100 Station: -1 Exam by:: Pamala Hurry, MD   A / P:  30 y.o.  Obstetric History   G1   P0   T0   P0   A0   L0    SAB0   TAB0   Ectopic0   Multiple0   Live Births0    at [redacted]w[redacted]d IOL for cholestasis, finally in active labor and starting to have cervical change. Continue pitocin  Fetal Wellbeing:  Category I Pain Control:  Epidural  Anticipated MOD:  NSVD  Orly Quimby A. 12/01/2015, 1:21 AM

## 2015-12-02 DIAGNOSIS — K831 Obstruction of bile duct: Secondary | ICD-10-CM | POA: Diagnosis present

## 2015-12-02 DIAGNOSIS — D62 Acute posthemorrhagic anemia: Secondary | ICD-10-CM | POA: Diagnosis not present

## 2015-12-02 MED ORDER — RHO D IMMUNE GLOBULIN 1500 UNIT/2ML IJ SOSY
300.0000 ug | PREFILLED_SYRINGE | Freq: Once | INTRAMUSCULAR | Status: DC
  Filled 2015-12-02: qty 2

## 2015-12-02 MED ORDER — POLYSACCHARIDE IRON COMPLEX 150 MG PO CAPS
150.0000 mg | ORAL_CAPSULE | Freq: Two times a day (BID) | ORAL | Status: DC
  Administered 2015-12-02 – 2015-12-03 (×3): 150 mg via ORAL
  Filled 2015-12-02 (×3): qty 1

## 2015-12-02 MED ORDER — MAGNESIUM OXIDE 400 (241.3 MG) MG PO TABS
400.0000 mg | ORAL_TABLET | Freq: Every day | ORAL | Status: DC
  Administered 2015-12-02 – 2015-12-03 (×2): 400 mg via ORAL
  Filled 2015-12-02 (×3): qty 1

## 2015-12-02 MED ORDER — RHO D IMMUNE GLOBULIN 1500 UNIT/2ML IJ SOSY
300.0000 ug | PREFILLED_SYRINGE | Freq: Once | INTRAMUSCULAR | Status: AC
  Administered 2015-12-02: 300 ug via INTRAVENOUS
  Filled 2015-12-02: qty 2

## 2015-12-02 NOTE — Lactation Note (Signed)
This note was copied from a baby's chart. Lactation Consultation Note  Patient Name: Annette Lawson [id number] Date: 12/02/2015 Reason for consult: Follow-up assessment;Other (Comment) (Early term Infant / 3% weight loss ) LC initially started today's consult at 1515 , baby recently fed and was sleeping afterwards.  LC re-visited mom and baby at due to baby hearing baby crying in the hallway.  LC checked diaper ( has only wet x 1 in the last 36 hours and 4 stools ). Diaper dry .  LC placed baby STS and baby latched for 5 mins and released.  LC assessed oral cavity with gloved finger and noted a high palate, intermittent humping back of tongue.  When sucking , no restrictions noted. LC did not observe baby extend tongue over gum line. Per dad has seen her several  Times extend tongue over gumline. LC discussed with mom adding a Nipple shield and some supplement of formula to the [facility] plan for  Latching due to only one wet and majority of feedings have been on and off patterns and age of baby, also mom is O- / baby O+.  LC sized mom for #20 NS to start/ good fit . Instilled 2 ml of formula into the top/ latched the baby on the right breast/ and after baby  Got into a consistent pattern/ inserted a 57F SNS and baby fed 20 mins and took 14 ml of formula ( total ) and sustained the depth and latch  Without coming off. Per mom this is the best she has fed ever. After the feeding re-sized mom for #24 NS - fit well - boarder line to to big.  Both Nipple [place] [employer] need to be re-sized prior to D/C, ( mom aware ).  LC reviewed LC plan - apply Nipple Shield #20 and instill appetizer of EBM or formula with curved tip syringe , after baby in a consistent  Pattern add 57F feeding tube . ( mom will need assist ), supplement with LPT guidelines. ( sheet given to mom ). And post pump both breast for 10- 15 mins.  Save milk for next feed. Mom is exhausted - recommended a nap. LC reviewed cleaning of the breast  feeding tools and pump pieces.  Mom also aware of the recommendation of a LC O/P appt. After D/C . appt . Needs to be made before D/C.   Maternal Data Has patient been taught Hand Expression?: Yes  Feeding Feeding Type: Formula Length of feed: 20 min  LATCH Score/Interventions Latch: Grasps breast easily, tongue down, lips flanged, rhythmical sucking. Intervention(s): Adjust position;Assist with latch;Breast massage;Breast compression  Audible Swallowing: Spontaneous and intermittent  Type of Nipple: Everted at rest and after stimulation  Comfort (Breast/Nipple): Soft / non-tender     Hold (Positioning): Assistance needed to correctly position infant at breast and maintain latch. Intervention(s): Breastfeeding basics reviewed;Position options;Skin to skin;Support Pillows  LATCH Score: 9  Lactation Tools Discussed/Used Tools: Nipple Shields;57F feeding tube / Syringe;Pump Nipple shield size: 20 Breast pump type: Double-Electric Breast Pump WIC Program: No   Consult Status Consult Status: Follow-up Date: 12/03/15 Follow-up type: In-patient    [place] 12/02/2015, 5:24 PM

## 2015-12-02 NOTE — Progress Notes (Signed)
PPD # 1 SVD Information for the patient's newborn:  Shaquania, Keelan [id number]  female    breast feeding    S:  Reports feeling tired but better.             Tolerating po/ No nausea or vomiting             Bleeding is decreased.             Pain controlled with  PO meds             Up ad lib / ambulatory / voiding without difficulties        O:  A & O x 3, in no apparent distress              VS:  Vitals:   12/01/15 1206 12/01/15 1756 12/01/15 2000 12/02/15 0521  BP: (!) 112/59 (!) 102/54 122/71 118/66  Pulse: 79 75 79 74  Resp: 18 18 18 18   Temp: 98.2 F (36.8 C) 98.2 F (36.8 C) 98.2 F (36.8 C) 98.2 F (36.8 C)  TempSrc: Oral Oral Oral Oral  SpO2:   100%   Weight:      Height:        LABS:  Recent Labs  12/01/15 0530 12/01/15 1222  WBC 16.4* 24.1*  HGB 9.4* 8.0*  HCT 28.4* 23.6*  PLT 278 222    Blood type: --/--/O NEG (11/21 0750) / infant Rh positive  Rubella: Immune (05/10 0000)   I&O: I/O last 3 completed shifts: In: -  Out: 2403 [Urine:1025; Blood:1378]          No intake/output data recorded.  Lungs: Clear and unlabored  Heart: regular rate and rhythm / no murmurs  Abdomen: soft, non-tender, non-distended             Fundus: firm, non-tender, U-1  Perineum: mild edema, repair intact  Lochia: scant  Extremities: + pedal edema, no calf pain or tenderness    A/P: PPD # 1 30 y.o., G1P1001   Principal Problem:   Postpartum care following vaginal delivery (11/22) Active Problems:   Postpartum hemorrhage (1200cc)   Second-degree perineal laceration, with delivery   Acute blood loss anemia   Cholestasis of pregnancy   Doing well - stable status  Hemodynamically stable  Oral Fe started, continue 4-6 wks PP  Rhophylac today  Routine post partum orders  Anticipate discharge tomorrow    Juliene Pina, MSN, CNM 12/02/2015, 9:19 AM

## 2015-12-03 LAB — RH IG WORKUP (INCLUDES ABO/RH)
ABO/RH(D): O NEG
Fetal Screen: NEGATIVE
GESTATIONAL AGE(WKS): 37
Unit division: 0

## 2015-12-03 MED ORDER — PRENATAL MULTIVITAMIN CH: 1.0000 | ORAL_TABLET | Freq: Every day | ORAL | Status: DC

## 2015-12-03 MED ORDER — MAGNESIUM OXIDE 400 (241.3 MG) MG PO TABS: 400.0000 mg | ORAL_TABLET | Freq: Every day | ORAL | Status: DC

## 2015-12-03 MED ORDER — IBUPROFEN 600 MG PO TABS: 600.0000 mg | ORAL_TABLET | Freq: Four times a day (QID) | ORAL | 0 refills | Status: DC

## 2015-12-03 MED ORDER — BENZOCAINE-MENTHOL 20-0.5 % EX AERO: 1.0000 "application " | INHALATION_SPRAY | CUTANEOUS | Status: DC | PRN

## 2015-12-03 MED ORDER — COCONUT OIL OIL: 1.0000 "application " | TOPICAL_OIL | 0 refills | Status: DC | PRN

## 2015-12-03 MED ORDER — POLYSACCHARIDE IRON COMPLEX 150 MG PO CAPS: 150.0000 mg | ORAL_CAPSULE | Freq: Two times a day (BID) | ORAL | 2 refills | Status: DC

## 2015-12-03 NOTE — Lactation Note (Signed)
This note was copied from a baby's chart. Lactation Consultation Note: Mother taught to properly apply nipple shield. Infant latched on for a few sucks and popped off. Infant latched on the bare breast. sns with 10 ml of alementum given to infant. Observed continued suckling for 20 mins. Mother very pleased that infant was latching without the nipple shield, reviewed engorgement and S/S of Mastitis. Mother advised to continue to feed infant 8-12 times in 24 hours. Mother was offered LC follow up visit. Mother states that she will call if needed. She is aware of other available [facility] services.  Patient Name: Girl Jandy [place] Today's Date: 12/03/2015     Maternal Data    Feeding Feeding Type: Breast Fed  LATCH Score/Interventions Latch: Grasps breast easily, tongue down, lips flanged, rhythmical sucking.  Audible Swallowing: Spontaneous and intermittent Intervention(s): Skin to skin Intervention(s): Hand expression  Type of Nipple: Everted at rest and after stimulation  Comfort (Breast/Nipple): Soft / non-tender     Hold (Positioning): Assistance needed to correctly position infant at breast and maintain latch. Intervention(s): Support Pillows;Position options  LATCH Score: 9  Lactation Tools Discussed/Used     Consult Status Consult Status: Complete    Darla Lesches 12/03/2015, 10:03 AM

## 2015-12-03 NOTE — Progress Notes (Signed)
Post Partum Day #2           Information for the patient's newborn:  Andalyn, Wemyss [id number]  female   Baby name: Natalynn Feeding: breast, doing better since NS and SIS started with LC.  Subjective: No HA, SOB, CP, F/C, breast symptoms. Pain controlled w/ PO meds. Normal vaginal bleeding, no clots.      Objective:  VS:  Vitals:   12/01/15 2000 12/02/15 0521 12/02/15 1800 12/03/15 0550  BP: 122/71 118/66 123/70 133/80  Pulse: 79 74 79 75  Resp: 18 18 18 18   Temp: 98.2 F (36.8 C) 98.2 F (36.8 C) 98.1 F (36.7 C) 98.3 F (36.8 C)  TempSrc: Oral Oral Oral Oral  SpO2: 100%     Weight:      Height:        No intake or output data in the 24 hours ending 12/03/15 1245     Recent Labs  12/01/15 0530 12/01/15 1222  WBC 16.4* 24.1*  HGB 9.4* 8.0*  HCT 28.4* 23.6*  PLT 278 222    Blood type: --/--/O NEG (11/23 0514) Rubella: Immune (05/10 0000)    Physical Exam:  General: alert, cooperative and no distress Uterine Fundus: firm Lochia: appropriate Perineum: repair intact, edema minimal DVT Evaluation: No cords or calf tenderness. Calf/Ankle edema is present.    Assessment/Plan: PPD # 2 / 30 y.o., G1P1001 S/P:induced vaginal   Principal Problem:   Postpartum care following vaginal delivery (11/22) Active Problems:   Postpartum hemorrhage (1200cc)   Second-degree perineal laceration, with delivery   Acute blood loss anemia   Cholestasis of pregnancy  - F/U LFTS at PP visit  - oral Fe and Mag-ox 4-6 wks PP, increased iron in diet.    normal postpartum exam  Continue current postpartum care  D/C home   LOS: 3 days   Juliene Pina, CNM, MSN 12/03/2015, 12:45 PM

## 2015-12-03 NOTE — Discharge Summary (Signed)
Obstetric Discharge Summary Reason for Admission: induction of labor and Cholestasis of pregnancy Prenatal Procedures: NST and ultrasound Intrapartum Procedures: spontaneous vaginal delivery, uterine exploration, GBS prophylaxis and epidural Postpartum Procedures: antibiotics and Rho(D) Ig Complications-Operative and Postpartum: 2nd degree perineal laceration and hemorrhage Hemoglobin  Date Value Ref Range Status  12/01/2015 8.0 (L) 12.0 - 15.0 g/dL Final   HCT  Date Value Ref Range Status  12/01/2015 23.6 (L) 36.0 - 46.0 % Final    Physical Exam:  General: alert, cooperative and no distress Lochia: appropriate Uterine Fundus: firm Incision: healing well DVT Evaluation: No cords or calf tenderness. Calf/Ankle edema is present.  Discharge Diagnoses: Term Pregnancy-delivered and acute blood loss anemia  Discharge Information: Date: 12/03/2015 Activity: pelvic rest Diet: routine Medications: PNV, Ibuprofen, Colace and Iron Condition: stable Instructions: refer to practice specific booklet Discharge to: home Follow-up Information    Princess Bruins, MD. Schedule an appointment as soon as possible for a visit in 6 week(s).   Specialty:  Obstetrics and Gynecology Contact information: [place] [address] [phone]           Newborn Data: Live born female [place] Birth Weight: 6 lb 13 oz (3090 g) APGAR: 3, 7  Home with mother.  Juliene Pina 12/03/2015, 1:51 PM

## 2015-12-04 LAB — TYPE AND SCREEN
ABO/RH(D): O NEG
Antibody Screen: POSITIVE
DAT, IGG: NEGATIVE
UNIT DIVISION: 0
Unit division: 0

## 2016-12-21 ENCOUNTER — Other Ambulatory Visit: Payer: Self-pay | Admitting: Nurse Practitioner

## 2017-01-25 ENCOUNTER — Ambulatory Visit: Payer: BC Managed Care – PPO | Admitting: Nurse Practitioner

## 2017-01-25 ENCOUNTER — Encounter: Payer: Self-pay | Admitting: Nurse Practitioner

## 2017-01-25 VITALS — BP 100/80 | HR 90 | Resp 16 | Ht 66.0 in | Wt 197.6 lb

## 2017-01-25 DIAGNOSIS — E559 Vitamin D deficiency, unspecified: Secondary | ICD-10-CM | POA: Diagnosis not present

## 2017-01-25 DIAGNOSIS — R030 Elevated blood-pressure reading, without diagnosis of hypertension: Secondary | ICD-10-CM | POA: Diagnosis not present

## 2017-01-25 NOTE — Progress Notes (Signed)
[facility] [place], [place] [address]  Internal MEDICINE  Office Visit Note  Patient Name: Annette Lawson  [id number]  [id number]  Date of Service: 01/25/2017  Chief Complaint  Patient presents with  . Hypertension    Hypertension  This is a new problem. The current episode started yesterday. The problem has been rapidly improving since onset. Associated symptoms include anxiety. Pertinent negatives include no chest pain, neck pain, palpitations or shortness of breath. There are no associated agents to hypertension. Risk factors for coronary artery disease include stress. Past treatments include nothing. There are no compliance problems.     Pt is here for routine follow up.    Current Medication: Outpatient Encounter Medications as of 01/25/2017  Medication Sig Note  . amphetamine-dextroamphetamine (ADDERALL) 10 MG tablet Take 10 mg by mouth 3 (three) times daily.   . norethindrone (NORLYDA) 0.35 MG tablet Take 1 tablet by mouth daily.   Marland Kitchen acetaminophen (TYLENOL) 500 MG tablet Take 500 mg by mouth every 6 (six) hours as needed for mild pain or headache.   . benzocaine-Menthol (DERMOPLAST) 20-0.5 % AERO Apply 1 application topically as needed for irritation (perineal discomfort). (Patient not taking: Reported on 01/25/2017)   . calcium carbonate (TUMS - DOSED IN MG ELEMENTAL CALCIUM) 500 MG chewable tablet Chew 2 tablets by mouth 3 (three) times daily as needed for indigestion or heartburn.   . coconut oil OIL Apply 1 application topically as needed. (Patient not taking: Reported on 01/25/2017)   . diphenhydrAMINE (BENADRYL) 25 MG tablet Take 25-50 mg by mouth at bedtime as needed for itching.   Marland Kitchen ibuprofen (ADVIL,MOTRIN) 600 MG tablet Take 1 tablet (600 mg total) by mouth every 6 (six) hours. (Patient not taking: Reported on 01/25/2017)   . iron polysaccharides (NIFEREX) 150 MG capsule Take 1 capsule (150 mg total) by mouth 2 (two) times daily. (Patient not  taking: Reported on 01/25/2017)   . magnesium oxide (MAG-OX) 400 (241.3 Mg) MG tablet Take 1 tablet (400 mg total) by mouth daily. (Patient not taking: Reported on 01/25/2017)   . Prenatal Vit-Fe Fumarate-FA (PRENATAL MULTIVITAMIN) TABS tablet Take 1 tablet by mouth daily at 12 noon. (Patient not taking: Reported on 01/25/2017)   . sertraline (ZOLOFT) 100 MG tablet Take 100 mg by mouth daily.  11/30/2015: Received from: External Pharmacy   No facility-administered encounter medications on file as of 01/25/2017.     Surgical History: Past Surgical History:  Procedure Laterality Date  . No surgical hx    . WISDOM TOOTH EXTRACTION      Medical History: Past Medical History:  Diagnosis Date  . ADD (attention deficit disorder)   . Cholestasis during pregnancy   . Depression   . Vaginal Pap smear, abnormal     Family History: Family History  Problem Relation Age of Onset  . COPD Maternal Grandmother        reactive airway disease    Social History   Socioeconomic History  . Marital status: Married    Spouse name: Not on file  . Number of children: Not on file  . Years of education: Not on file  . Highest education level: Not on file  Social Needs  . Financial resource strain: Not on file  . Food insecurity - worry: Not on file  . Food insecurity - inability: Not on file  . Transportation needs - medical: Not on file  . Transportation needs - non-medical: Not on file  Occupational History  .  Not on file  Tobacco Use  . Smoking status: Current Some Day Smoker  . Smokeless tobacco: Never Used  Substance and Sexual Activity  . Alcohol use: Yes    Alcohol/week: 0.0 oz    Comment: Occasional  . Drug use: No  . Sexual activity: Not on file  Other Topics Concern  . Not on file  Social History Narrative  . Not on file      Review of Systems  Constitutional: Negative for activity change, chills, fatigue and unexpected weight change.  HENT: Positive for postnasal drip.  Negative for rhinorrhea, sneezing and sore throat.   Eyes: Negative for redness.  Respiratory: Negative for cough, chest tightness and shortness of breath.   Cardiovascular: Negative for chest pain and palpitations.       Single episode yesterday of elevated blood pressure and headache. States that her blood pressure was 146/86  Gastrointestinal: Negative for abdominal pain, constipation, diarrhea, nausea and vomiting.  Genitourinary: Negative for dysuria and frequency.  Musculoskeletal: Negative for arthralgias, back pain, joint swelling and neck pain.  Skin: Negative for rash.  Neurological: Negative.  Negative for tremors and numbness.  Hematological: Negative for adenopathy. Does not bruise/bleed easily.  Psychiatric/Behavioral: Negative for behavioral problems (Depression), sleep disturbance and suicidal ideas. The patient is not nervous/anxious.     Today's Vitals   01/25/17 1206  BP: 100/80  Pulse: 90  Resp: 16  SpO2: 98%  Weight: 197 lb 9.6 oz (89.6 kg)  Height: 5\' 6"  (1.676 m)    Physical Exam  Constitutional: She is oriented to person, place, and time. She appears well-developed and well-nourished.  HENT:  Head: Normocephalic and atraumatic.  Eyes: Pupils are equal, round, and reactive to light.  Neck: Normal range of motion. Neck supple. No thyromegaly present.  Cardiovascular: Normal rate, regular rhythm and normal heart sounds.  Pulmonary/Chest: Effort normal and breath sounds normal. She has no wheezes.  Abdominal: Soft. Bowel sounds are normal. There is no tenderness.  Musculoskeletal: Normal range of motion.  Neurological: She is alert and oriented to person, place, and time.  Skin: Skin is warm and dry.  Psychiatric: She has a normal mood and affect. Her behavior is normal. Judgment and thought content normal.    Assessment/Plan:  1. Elevated blood pressure reading without diagnosis of hypertension Few, intermittent episodes of elevated blood pressure. This  is resolved today. Advised her to monitor bp closely and contact the office if bp is sonsistently elevated.   2. Vitamin d deficiency - recommended OTC vitamin d supplement every day.   General Counseling: Annette Lawson verbalizes understanding of the findings of todays visit and agrees with plan of treatment. I have discussed any further diagnostic evaluation that may be needed or ordered today. We also reviewed her medications today. she has been encouraged to call the office with any questions or concerns that should arise related to todays visit.  This patient was seen by Leretha Pol, FNP- C in Collaboration with Dr Lavera Guise as a part of collaborative care agreement    Time spent: 54 Minutes    Dr Lavera Guise Internal medicine

## 2017-01-25 NOTE — Patient Instructions (Addendum)

## 2017-01-26 LAB — FSH/LH
FSH: 3.7 m[IU]/mL
LH: 10.4 m[IU]/mL

## 2017-01-26 LAB — THYROID ANTIBODIES: Thyroperoxidase Ab SerPl-aCnc: 11 IU/mL (ref 0–34)

## 2017-01-26 LAB — T4, FREE: Free T4: 1.19 ng/dL (ref 0.82–1.77)

## 2017-01-26 LAB — TSH: TSH: 0.471 u[IU]/mL (ref 0.450–4.500)

## 2017-01-26 LAB — ESTRADIOL: ESTRADIOL: 412.4 pg/mL

## 2017-02-07 DIAGNOSIS — R030 Elevated blood-pressure reading, without diagnosis of hypertension: Secondary | ICD-10-CM | POA: Insufficient documentation

## 2017-02-07 DIAGNOSIS — E559 Vitamin D deficiency, unspecified: Secondary | ICD-10-CM | POA: Insufficient documentation

## 2017-07-02 ENCOUNTER — Encounter: Payer: Self-pay | Admitting: Nurse Practitioner

## 2017-07-02 ENCOUNTER — Ambulatory Visit: Payer: BC Managed Care – PPO | Admitting: Nurse Practitioner

## 2017-07-02 VITALS — BP 123/83 | HR 88 | Temp 97.9°F | Resp 16 | Ht 66.0 in | Wt 185.0 lb

## 2017-07-02 DIAGNOSIS — E042 Nontoxic multinodular goiter: Secondary | ICD-10-CM | POA: Diagnosis not present

## 2017-07-02 DIAGNOSIS — R1011 Right upper quadrant pain: Secondary | ICD-10-CM | POA: Insufficient documentation

## 2017-07-02 DIAGNOSIS — K831 Obstruction of bile duct: Secondary | ICD-10-CM | POA: Diagnosis not present

## 2017-07-02 NOTE — Progress Notes (Signed)
[facility] [place], [place] [address]  Internal MEDICINE  Office Visit Note  Patient Name: Annette Lawson  [id number]  [id number]  Date of Service: 07/02/2017  Chief Complaint  Patient presents with  . Abdominal Pain    caused pain in left shoulder to down her arm/also had cold chills  . Thyroid Problem    thyroid is swollen     Abdominal Pain  This is a recurrent problem. The current episode started yesterday. The onset quality is sudden. The problem occurs constantly. The problem has been gradually improving. The pain is located in the RUQ. The pain is at a severity of 8/10. The quality of the pain is colicky, cramping and a sensation of fullness. The abdominal pain radiates to the LLQ and right shoulder. Associated symptoms include belching, diarrhea, flatus and nausea. Pertinent negatives include no arthralgias, constipation, dysuria, frequency or vomiting. The pain is aggravated by eating. The pain is relieved by belching and passing flatus. She has tried nothing for the symptoms. Prior diagnostic workup includes ultrasound (ultrasound during her pregnancy did show cholecystitis ).  Thyroid Problem  Presents for follow-up (enlarged thyroid gland. feels larger. having some trouble swallowing at times. ) visit. Symptoms include diarrhea and fatigue. Patient reports no anxiety, constipation, palpitations or tremors.   Pt is here for a sick visit.     Current Medication:  Outpatient Encounter Medications as of 07/02/2017  Medication Sig Note  . acetaminophen (TYLENOL) 500 MG tablet Take 500 mg by mouth every 6 (six) hours as needed for mild pain or headache.   . amphetamine-dextroamphetamine (ADDERALL) 10 MG tablet Take 10 mg by mouth 3 (three) times daily.   . benzocaine-Menthol (DERMOPLAST) 20-0.5 % AERO Apply 1 application topically as needed for irritation (perineal discomfort). (Patient not taking: Reported on 01/25/2017)   . calcium carbonate  (TUMS - DOSED IN MG ELEMENTAL CALCIUM) 500 MG chewable tablet Chew 2 tablets by mouth 3 (three) times daily as needed for indigestion or heartburn.   . coconut oil OIL Apply 1 application topically as needed. (Patient not taking: Reported on 01/25/2017)   . diphenhydrAMINE (BENADRYL) 25 MG tablet Take 25-50 mg by mouth at bedtime as needed for itching.   Marland Kitchen ibuprofen (ADVIL,MOTRIN) 600 MG tablet Take 1 tablet (600 mg total) by mouth every 6 (six) hours. (Patient not taking: Reported on 01/25/2017)   . iron polysaccharides (NIFEREX) 150 MG capsule Take 1 capsule (150 mg total) by mouth 2 (two) times daily. (Patient not taking: Reported on 01/25/2017)   . magnesium oxide (MAG-OX) 400 (241.3 Mg) MG tablet Take 1 tablet (400 mg total) by mouth daily. (Patient not taking: Reported on 01/25/2017)   . norethindrone (NORLYDA) 0.35 MG tablet Take 1 tablet by mouth daily.   . Prenatal Vit-Fe Fumarate-FA (PRENATAL MULTIVITAMIN) TABS tablet Take 1 tablet by mouth daily at 12 noon. (Patient not taking: Reported on 01/25/2017)   . sertraline (ZOLOFT) 100 MG tablet Take 100 mg by mouth daily.  11/30/2015: Received from: External Pharmacy   No facility-administered encounter medications on file as of 07/02/2017.       Medical History: Past Medical History:  Diagnosis Date  . ADD (attention deficit disorder)   . Cholestasis during pregnancy   . Depression   . Vaginal Pap smear, abnormal      Vital Signs: BP 123/83   Pulse 88   Temp 97.9 F (36.6 C)   Resp 16   Ht 5\' 6"  (1.676 m)  Wt 185 lb (83.9 kg)   SpO2 97%   BMI 29.86 kg/m    Review of Systems  Constitutional: Positive for appetite change and fatigue. Negative for activity change, chills and unexpected weight change.  HENT: Positive for trouble swallowing. Negative for postnasal drip, rhinorrhea, sneezing and sore throat.   Eyes: Negative.  Negative for redness.  Respiratory: Negative for cough, chest tightness and shortness of breath.    Cardiovascular: Negative for chest pain and palpitations.  Gastrointestinal: Positive for abdominal pain, diarrhea, flatus and nausea. Negative for constipation and vomiting.  Endocrine:       Enlarged thyroid. Feels like it's getting bigger.   Genitourinary: Negative for dysuria and frequency.  Musculoskeletal: Negative for arthralgias, back pain, joint swelling and neck pain.  Skin: Negative for rash.  Neurological: Negative.  Negative for tremors and numbness.  Hematological: Negative for adenopathy. Does not bruise/bleed easily.  Psychiatric/Behavioral: Negative for behavioral problems (Depression), sleep disturbance and suicidal ideas. The patient is not nervous/anxious.     Physical Exam  Constitutional: She is oriented to person, place, and time. She appears well-developed and well-nourished.  HENT:  Head: Normocephalic and atraumatic.  Eyes: Pupils are equal, round, and reactive to light.  Neck: Normal range of motion. Neck supple. No thyromegaly present.  Cardiovascular: Normal rate, regular rhythm and normal heart sounds.  Pulmonary/Chest: Effort normal and breath sounds normal. She has no wheezes.  Abdominal: Soft. Bowel sounds are normal. There is tenderness in the right upper quadrant, epigastric area and left lower quadrant. There is guarding and positive Murphy's sign. No hernia.  Musculoskeletal: Normal range of motion.  Neurological: She is alert and oriented to person, place, and time.  Skin: Skin is warm and dry. Capillary refill takes less than 2 seconds.  Psychiatric: She has a normal mood and affect. Her behavior is normal. Judgment and thought content normal.    Assessment/Plan: 1. Right upper quadrant abdominal pain Severe with positive Murphy's sign. History of cholestasis in pregnancy. Will get RUQ ultrasound for further evaluation.  - US Abdomen Limited RUQ; Future  2. Multinodular goiter Check full thyroid panel. Get ultrasound of thyroid and compare to  prior results. Refer as indicated.  - US Soft Tissue Head/Neck; Future - TSH + free T4 - Thyroglobulin Level - Thyroid peroxidase antibody  3. Cholestasis Cholestasis during pregnancy. Having sever right upper quadrant pain. U/s of RUQ for further evaluation.  - US Abdomen Limited RUQ; Future  General Counseling: Jasmane verbalizes understanding of the findings of todays visit and agrees with plan of treatment. I have discussed any further diagnostic evaluation that may be needed or ordered today. We also reviewed her medications today. she has been encouraged to call the office with any questions or concerns that should arise related to todays visit.    Counseling: This patient was seen by Leretha Pol, FNP- C in Collaboration with Dr Lavera Guise as a part of collaborative care agreement   Orders Placed This Encounter  Procedures  . US Abdomen Limited RUQ  . US Soft Tissue Head/Neck  . TSH + free T4  . Thyroglobulin Level  . Thyroid peroxidase antibody      Time spent: 15 Minutes

## 2017-07-03 ENCOUNTER — Ambulatory Visit: Payer: BC Managed Care – PPO

## 2017-07-05 ENCOUNTER — Ambulatory Visit
Admission: RE | Admit: 2017-07-05 | Discharge: 2017-07-05 | Disposition: A | Discharge status: Home / Self Care | Payer: BC Managed Care – PPO | Source: Ambulatory Visit | Attending: Nurse Practitioner | Admitting: Nurse Practitioner

## 2017-07-05 ENCOUNTER — Telehealth: Payer: Self-pay

## 2017-07-05 DIAGNOSIS — R1011 Right upper quadrant pain: Secondary | ICD-10-CM | POA: Insufficient documentation

## 2017-07-05 DIAGNOSIS — E042 Nontoxic multinodular goiter: Secondary | ICD-10-CM

## 2017-07-05 DIAGNOSIS — K831 Obstruction of bile duct: Secondary | ICD-10-CM

## 2017-07-05 NOTE — Telephone Encounter (Signed)
Pt advised labs and U/s  Is completely normal if still having pain we can send to GI for further evaluation

## 2017-07-05 NOTE — Telephone Encounter (Signed)
Please let her know that labs and ultrasound were both completely normal. If abdominal pain is persistent, we should send her to see GI provider. Thanks

## 2017-07-07 LAB — TSH+FREE T4
FREE T4: 1.11 ng/dL (ref 0.82–1.77)
TSH: 0.461 u[IU]/mL (ref 0.450–4.500)

## 2017-07-07 LAB — THYROID PEROXIDASE ANTIBODY: Thyroperoxidase Ab SerPl-aCnc: 10 IU/mL (ref 0–34)

## 2017-07-07 LAB — THYROGLOBULIN LEVEL: THYROGLOBULIN (TG-RIA): 25 ng/mL

## 2017-07-26 ENCOUNTER — Ambulatory Visit: Payer: Self-pay | Admitting: Nurse Practitioner

## 2017-11-05 ENCOUNTER — Other Ambulatory Visit: Payer: Self-pay | Admitting: Nurse Practitioner

## 2017-11-05 ENCOUNTER — Telehealth: Payer: Self-pay | Admitting: Nurse Practitioner

## 2017-11-05 DIAGNOSIS — N926 Irregular menstruation, unspecified: Secondary | ICD-10-CM

## 2017-11-05 NOTE — Progress Notes (Signed)
Sent order for serum qualitative and quantitative pregnancy test to [facility]. She should be able to go anytime.

## 2017-11-05 NOTE — Telephone Encounter (Signed)
Sent order for serum qualitative and quantitative pregnancy test to [facility]. She should be able to go anytime.

## 2017-11-05 NOTE — Telephone Encounter (Signed)
Patient advised on lab orders placed

## 2017-11-05 NOTE — Telephone Encounter (Signed)
Patient called asking if you can order a blood pregnancy test and send to [facility] in [facility]

## 2017-11-06 ENCOUNTER — Other Ambulatory Visit: Payer: Self-pay | Admitting: Nurse Practitioner

## 2017-11-07 LAB — HCG, SERUM, QUALITATIVE: hCG,Beta Subunit,Qual,Serum: POSITIVE m[IU]/mL — AB (ref <6)

## 2017-11-07 LAB — BETA HCG QUANT (REF LAB): hCG Quant: 5497 m[IU]/mL

## 2017-11-08 ENCOUNTER — Telehealth: Payer: Self-pay

## 2017-11-08 LAB — HCG, SERUM, QUALITATIVE: HCG, BETA SUBUNIT, QUAL, SERUM: POSITIVE m[IU]/mL — AB (ref <6)

## 2017-11-08 LAB — BETA HCG QUANT (REF LAB): hCG Quant: 5497 m[IU]/mL

## 2017-11-08 NOTE — Telephone Encounter (Signed)
Pt advised that pregnacy is positive need to see obgyn

## 2017-11-13 NOTE — Progress Notes (Signed)
Hey Nimisha. You already talked to this patient to advise her of her lab results, right?

## 2017-11-20 ENCOUNTER — Other Ambulatory Visit (HOSPITAL_COMMUNITY)
Admission: RE | Admit: 2017-11-20 | Discharge: 2017-11-20 | Disposition: A | Discharge status: Home / Self Care | Payer: BC Managed Care – PPO | Source: Ambulatory Visit | Attending: Obstetrics and Gynecology | Admitting: Obstetrics and Gynecology

## 2017-11-20 ENCOUNTER — Ambulatory Visit (INDEPENDENT_AMBULATORY_CARE_PROVIDER_SITE_OTHER): Payer: BC Managed Care – PPO | Admitting: Obstetrics and Gynecology

## 2017-11-20 ENCOUNTER — Other Ambulatory Visit: Payer: Self-pay | Admitting: Obstetrics and Gynecology

## 2017-11-20 ENCOUNTER — Encounter: Payer: Self-pay | Admitting: Obstetrics and Gynecology

## 2017-11-20 ENCOUNTER — Ambulatory Visit (INDEPENDENT_AMBULATORY_CARE_PROVIDER_SITE_OTHER): Payer: BC Managed Care – PPO

## 2017-11-20 VITALS — BP 118/74 | Wt 190.0 lb

## 2017-11-20 DIAGNOSIS — Z3480 Encounter for supervision of other normal pregnancy, unspecified trimester: Secondary | ICD-10-CM

## 2017-11-20 DIAGNOSIS — O3411 Maternal care for benign tumor of corpus uteri, first trimester: Secondary | ICD-10-CM

## 2017-11-20 DIAGNOSIS — Z3A01 Less than 8 weeks gestation of pregnancy: Secondary | ICD-10-CM

## 2017-11-20 DIAGNOSIS — O0990 Supervision of high risk pregnancy, unspecified, unspecified trimester: Secondary | ICD-10-CM | POA: Diagnosis not present

## 2017-11-20 DIAGNOSIS — F32A Depression, unspecified: Secondary | ICD-10-CM | POA: Insufficient documentation

## 2017-11-20 DIAGNOSIS — O99340 Other mental disorders complicating pregnancy, unspecified trimester: Secondary | ICD-10-CM

## 2017-11-20 DIAGNOSIS — N8311 Corpus luteum cyst of right ovary: Secondary | ICD-10-CM

## 2017-11-20 DIAGNOSIS — Z8719 Personal history of other diseases of the digestive system: Secondary | ICD-10-CM

## 2017-11-20 DIAGNOSIS — O26899 Other specified pregnancy related conditions, unspecified trimester: Secondary | ICD-10-CM

## 2017-11-20 DIAGNOSIS — O26891 Other specified pregnancy related conditions, first trimester: Secondary | ICD-10-CM

## 2017-11-20 DIAGNOSIS — Z124 Encounter for screening for malignant neoplasm of cervix: Secondary | ICD-10-CM

## 2017-11-20 DIAGNOSIS — O09291 Supervision of pregnancy with other poor reproductive or obstetric history, first trimester: Secondary | ICD-10-CM

## 2017-11-20 DIAGNOSIS — O99341 Other mental disorders complicating pregnancy, first trimester: Secondary | ICD-10-CM

## 2017-11-20 DIAGNOSIS — Z8759 Personal history of other complications of pregnancy, childbirth and the puerperium: Secondary | ICD-10-CM

## 2017-11-20 DIAGNOSIS — O09299 Supervision of pregnancy with other poor reproductive or obstetric history, unspecified trimester: Secondary | ICD-10-CM

## 2017-11-20 DIAGNOSIS — F329 Major depressive disorder, single episode, unspecified: Secondary | ICD-10-CM | POA: Insufficient documentation

## 2017-11-20 DIAGNOSIS — Z113 Encounter for screening for infections with a predominantly sexual mode of transmission: Secondary | ICD-10-CM

## 2017-11-20 DIAGNOSIS — Z6791 Unspecified blood type, Rh negative: Secondary | ICD-10-CM

## 2017-11-20 NOTE — Progress Notes (Signed)
New Obstetric Patient H&P   Chief Complaint: "Desires prenatal care"  History of Present Illness: Patient is a 32 y.o. G2P1001 Not Hispanic or Latino female, certain LMP 04/22/2438 (shorter than normal - 2-3 days) presents with amenorrhea and positive home pregnancy test. Based on her  LMP, her EDD is Estimated Date of Delivery: 07/06/18 and her EGA is [redacted]w[redacted]d. Cycles are 5. days, regular, and occur approximately every : 28 days. Her last pap smear was about 1.5 years ago and was no abnormalities.    She had a urine pregnancy test which was positive 2 or 3 week(s)  ago. Her last menstrual period was normal and lasted for  3 day(s). Since her LMP she claims she has experienced no issues. She denies vaginal bleeding. Her past medical history is noncontributory. Her prior pregnancies are notable for cholestasis, history of depression, rh negative, postpartum hemorrhage (no blood transfusion)  Since her LMP, she admits to the use of tobacco products  Quit when she found out she was pregnant. She claims she has gained zero pounds since the start of her pregnancy.  There are cats in the home in the home  no  She admits close contact with children on a regular basis  yes  She has had chicken pox in the past yes She has had Tuberculosis exposures, symptoms, or previously tested positive for TB   no Current or past history of domestic violence. no  Genetic Screening/Teratology Counseling: (Includes patient, baby's father, or anyone in either family with:)   71. Patient's age >/= 74 at [facility]  no 2. Thalassemia ([place], [place], [place], or Asian background): MCV<80  no 3. Neural tube defect (meningomyelocele, spina bifida, anencephaly)  no 4. Congenital heart defect  no  5. Down syndrome  no 6. Tay-Sachs (Jewish, [place])  no 7. Canavan's Disease  no 8. Sickle cell disease or trait (African)  no  9. Hemophilia or other blood disorders  no  10. Muscular dystrophy  no  11. Cystic fibrosis  no   12. Huntington's Chorea  no  13. Mental retardation/autism  no 14. Other inherited genetic or chromosomal disorder  no 15. Maternal metabolic disorder (DM, PKU, etc)  no 16. Patient or FOB with a child with a birth defect not listed above no  16a. Patient or FOB with a birth defect themselves no 17. Recurrent pregnancy loss, or stillbirth  no  18. Any medications since LMP other than prenatal vitamins (include vitamins, supplements, OTC meds, drugs, alcohol)  PNV, some adderal (still taking somewhat).  19. Any other genetic/environmental exposure to discuss  no  Infection History:   1. Lives with someone with TB or TB exposed  no  2. Patient or partner has history of genital herpes  no 3. Rash or viral illness since LMP  no 4. History of STI (GC, CT, HPV, syphilis, HIV)  no 5. History of recent travel :  no  Other pertinent information:  G1 with cholestasis, depression, rh negative, postpartum hemorrhage with G1.  She took Zoloft during her last pregnancy.  But, did not like the way it made her feel.  She states that she is taking only 1 of her 3 of her Adderall pills.    Review of Systems:10 point review of systems negative unless otherwise noted in HPI  Past Medical History:  Diagnosis Date  . ADD (attention deficit disorder)   . Cholestasis during pregnancy   . Depression   . Vaginal Pap smear, abnormal  Past Surgical History:  Procedure Laterality Date  . WISDOM TOOTH EXTRACTION     Gynecologic History: Patient's last menstrual period was 09/29/2017.  Obstetric History: G2P1001, SVD  Family History  Problem Relation Age of Onset  . Hypothyroidism Mother   . COPD Maternal Grandmother        reactive airway disease    Social History   Socioeconomic History  . Marital status: Married    Spouse name: Not on file  . Number of children: Not on file  . Years of education: Not on file  . Highest education level: Not on file  Occupational History  . Not on file   Social Needs  . Financial resource strain: Not on file  . Food insecurity:    Worry: Not on file    Inability: Not on file  . Transportation needs:    Medical: Not on file    Non-medical: Not on file  Tobacco Use  . Smoking status: Current Some Day Smoker  . Smokeless tobacco: Never Used  Substance and Sexual Activity  . Alcohol use: Yes    Alcohol/week: 0.0 standard drinks    Comment: Occasional  . Drug use: No  . Sexual activity: Yes    Birth control/protection: None  Lifestyle  . Physical activity:    Days per week: Not on file    Minutes per session: Not on file  . Stress: Not on file  Relationships  . Social connections:    Talks on phone: Not on file    Gets together: Not on file    Attends religious service: Not on file    Active member of club or organization: Not on file    Attends meetings of clubs or organizations: Not on file    Relationship status: Not on file  . Intimate partner violence:    Fear of current or ex partner: Not on file    Emotionally abused: Not on file    Physically abused: Not on file    Forced sexual activity: Not on file  Other Topics Concern  . Not on file  Social History Narrative  . Not on file   Allergies:  No Known Allergies  Prior to Admission medications   Medication Sig Start Date End Date Taking? Authorizing Provider  Prenatal Vit-Fe Fumarate-FA (PRENATAL MULTIVITAMIN) TABS tablet Take 1 tablet by mouth daily at 12 noon. 12/04/15  Yes Derrell Lolling C, CNM  amphetamine-dextroamphetamine (ADDERALL) 10 MG tablet Take 10 mg by mouth 3 (three) times daily.    [provider]    Physical Exam BP 118/74   Wt 190 lb (86.2 kg)   LMP 09/29/2017   BMI 30.67 kg/m   Physical Exam  Constitutional: She is oriented to person, place, and time. She appears well-developed and well-nourished. No distress.  HENT:  Head: Normocephalic and atraumatic.  Eyes: Conjunctivae are normal.  Neck: Normal range of motion. Neck supple.  No thyromegaly present.  Cardiovascular: Normal rate, regular rhythm and normal heart sounds. Exam reveals no gallop and no friction rub.  No murmur heard. Pulmonary/Chest: Effort normal and breath sounds normal. She has no wheezes.  Abdominal: Soft. She exhibits no distension and no mass. There is tenderness. There is no rebound and no guarding. No hernia. Hernia confirmed negative in the right inguinal area and confirmed negative in the left inguinal area.  Genitourinary: Pelvic exam was performed with patient supine. There is no rash, tenderness or lesion on the right labia. There is no rash,  tenderness or lesion on the left labia.  Musculoskeletal: Normal range of motion.  Lymphadenopathy:       Right: No inguinal adenopathy present.       Left: No inguinal adenopathy present.  Neurological: She is alert and oriented to person, place, and time.  Skin: Skin is warm and dry. No rash noted.  Psychiatric: She has a normal mood and affect. Her behavior is normal.    Female Chaperone present during breast and/or pelvic exam.  Imaging Results: US Ob Transvaginal  Result Date: 11/20/2017 Patient Name: Cana Mignano DOB: [birth year] MRN: [id number] ULTRASOUND REPORT Location: [facility] Date of Service: 11/20/2017 Indications:dating Findings: Nelda Marseille intrauterine pregnancy is visualized with a CRL consistent with [redacted]w[redacted]d gestation, giving an (U/S) EDD of 07/04/2018. The (U/S) EDD is consistent with the clinically established EDD of 07/06/2018. FHR: 149 BPM CRL measurement: 13.5 mm Yolk sac is visualized and appears normal and early anatomy is normal. Amnion: visualized and appears normal Right Ovary is normal in appearance. Left Ovary is normal appearance. Corpus luteal cyst:  Right ovary Survey of the adnexa demonstrates no adnexal masses. There is no free peritoneal fluid in the cul de sac. Impression: 1. [redacted]w[redacted]d Viable Singleton Intrauterine pregnancy by U/S. 2. (U/S) EDD is consistent with  Clinically established EDD of 07/06/2018. Vita Barley, RDMS RVT There is a viable singleton gestation.  Detailed evaluation of the fetal anatomy is precluded by early gestational age.  It must be noted that a normal ultrasound particular at this early gestational age is unable to rule out fetal aneuploidy, risk of first trimester miscarriage, or anatomic birth defects. Prentice Docker, MD, [facility], [facility] 11/20/2017 2:08 PM     EDPS: 4  Assessment: 32 y.o. G2P1001 at [redacted]w[redacted]d presenting to initiate prenatal care  Plan: 1) Avoid alcoholic beverages. 2) Patient encouraged not to smoke.  3) Discontinue the use of all non-medicinal drugs and chemicals.  4) Take prenatal vitamins daily.  5) Nutrition, food safety (fish, cheese advisories, and high nitrite foods) and exercise discussed. 6) Hospital and practice style discussed with cross coverage system.  7) Genetic Screening, such as with 1st Trimester Screening, cell free fetal DNA, AFP testing, and Ultrasound, as well as with amniocentesis and CVS as appropriate, is discussed with patient. At the conclusion of today's visit patient declined genetic testing 8) Patient is asked about travel to areas at risk for the [place] virus, and counseled to avoid travel and exposure to mosquitoes or sexual partners who may have themselves been exposed to the virus. Testing is discussed, and will be ordered as appropriate.  9) Rh negative: routine rh negative care. 10) history of cholestasis: baseline CMP 11) Depression: no meds currently. Monitor EDPS and symptom report.  12) h/o postpartum hemorrhage: will aggressively treat bleeding at time of delivery.  13) obesity: early 1 hour gtt  Prentice Docker, MD 11/20/2017 1:32 PM

## 2017-11-21 LAB — RPR+RH+ABO+RUB AB+AB SCR+CB...
ANTIBODY SCREEN: NEGATIVE
HIV SCREEN 4TH GENERATION: NONREACTIVE
Hematocrit: 39 % (ref 34.0–46.6)
Hemoglobin: 13.3 g/dL (ref 11.1–15.9)
Hepatitis B Surface Ag: NEGATIVE
MCH: 31.6 pg (ref 26.6–33.0)
MCHC: 34.1 g/dL (ref 31.5–35.7)
MCV: 93 fL (ref 79–97)
PLATELETS: 359 10*3/uL (ref 150–450)
RBC: 4.21 x10E6/uL (ref 3.77–5.28)
RDW: 11.8 % — AB (ref 12.3–15.4)
RPR: NONREACTIVE
Rh Factor: NEGATIVE
Rubella Antibodies, IGG: 2.48 index (ref >0.99)
Varicella zoster IgG: 707 index (ref >165)
WBC: 11.2 10*3/uL — AB (ref 3.4–10.8)

## 2017-11-21 LAB — COMPREHENSIVE METABOLIC PANEL
ALT: 9 IU/L (ref 0–32)
AST: 16 IU/L (ref 0–40)
Albumin/Globulin Ratio: 2 (ref 1.2–2.2)
Albumin: 4.3 g/dL (ref 3.5–5.5)
Alkaline Phosphatase: 83 IU/L (ref 39–117)
BUN/Creatinine Ratio: 11 (ref 9–23)
BUN: 7 mg/dL (ref 6–20)
Bilirubin Total: 0.2 mg/dL (ref 0.0–1.2)
CALCIUM: 8.9 mg/dL (ref 8.7–10.2)
CO2: 21 mmol/L (ref 20–29)
CREATININE: 0.64 mg/dL (ref 0.57–1.00)
Chloride: 101 mmol/L (ref 96–106)
GFR calc Af Amer: 138 mL/min/{1.73_m2} (ref >59)
GFR, EST NON AFRICAN AMERICAN: 119 mL/min/{1.73_m2} (ref >59)
GLUCOSE: 80 mg/dL (ref 65–99)
Globulin, Total: 2.1 g/dL (ref 1.5–4.5)
Potassium: 4.1 mmol/L (ref 3.5–5.2)
Sodium: 136 mmol/L (ref 134–144)
Total Protein: 6.4 g/dL (ref 6.0–8.5)

## 2017-11-21 LAB — TSH+FREE T4
FREE T4: 1.16 ng/dL (ref 0.82–1.77)
TSH: 0.403 u[IU]/mL — AB (ref 0.450–4.500)

## 2017-11-22 LAB — CYTOLOGY - PAP
CHLAMYDIA, DNA PROBE: NEGATIVE
Diagnosis: NEGATIVE
HPV: NOT DETECTED
Neisseria Gonorrhea: NEGATIVE

## 2017-11-23 LAB — URINE CULTURE

## 2017-11-25 LAB — URINE DRUG PANEL 7
AMPHETAMINES, URINE: POSITIVE — AB
BARBITURATE QUANT UR: NEGATIVE ng/mL
BENZODIAZEPINE QUANT UR: NEGATIVE ng/mL
Cannabinoid Quant, Ur: NEGATIVE ng/mL
Cocaine (Metab.): NEGATIVE ng/mL
OPIATE QUANT UR: NEGATIVE ng/mL
PCP Quant, Ur: NEGATIVE ng/mL

## 2017-12-14 ENCOUNTER — Telehealth: Payer: Self-pay

## 2017-12-14 NOTE — Telephone Encounter (Signed)
Pt calling to see which office to bring FMLA papers to.  [phone] Adv to bring them to the [employer] office.  Explained there is a process they will go thru, she will need to fill out papers for [place] as well and pay fee, and we ask 10 business days to fill them out.

## 2017-12-17 ENCOUNTER — Telehealth: Payer: Self-pay

## 2017-12-17 NOTE — Telephone Encounter (Signed)
Pt calling for safe meds for head cold.  [phone]  Adv plain robitussin, plain sudafed, e.s. Tylenol, Hall's cough drops, sucrets, warm salt water gargles, sip hot beverages/soups, push fluids, rest, saline nasal spray.

## 2017-12-18 ENCOUNTER — Telehealth: Payer: Self-pay

## 2017-12-18 NOTE — Telephone Encounter (Signed)
FMLA/DISABILITY form for [employer] filled out, signature obtained, and given to [place] for processing.

## 2017-12-19 ENCOUNTER — Ambulatory Visit (INDEPENDENT_AMBULATORY_CARE_PROVIDER_SITE_OTHER): Payer: BC Managed Care – PPO | Admitting: Obstetrics and Gynecology

## 2017-12-19 VITALS — BP 122/74 | Wt 190.0 lb

## 2017-12-19 DIAGNOSIS — O0990 Supervision of high risk pregnancy, unspecified, unspecified trimester: Secondary | ICD-10-CM

## 2017-12-19 DIAGNOSIS — O99340 Other mental disorders complicating pregnancy, unspecified trimester: Secondary | ICD-10-CM

## 2017-12-19 DIAGNOSIS — O99341 Other mental disorders complicating pregnancy, first trimester: Secondary | ICD-10-CM

## 2017-12-19 DIAGNOSIS — O09291 Supervision of pregnancy with other poor reproductive or obstetric history, first trimester: Secondary | ICD-10-CM

## 2017-12-19 DIAGNOSIS — Z8719 Personal history of other diseases of the digestive system: Secondary | ICD-10-CM

## 2017-12-19 DIAGNOSIS — Z6791 Unspecified blood type, Rh negative: Secondary | ICD-10-CM

## 2017-12-19 DIAGNOSIS — O26891 Other specified pregnancy related conditions, first trimester: Secondary | ICD-10-CM

## 2017-12-19 DIAGNOSIS — Z8759 Personal history of other complications of pregnancy, childbirth and the puerperium: Secondary | ICD-10-CM

## 2017-12-19 DIAGNOSIS — O0991 Supervision of high risk pregnancy, unspecified, first trimester: Secondary | ICD-10-CM

## 2017-12-19 DIAGNOSIS — O26899 Other specified pregnancy related conditions, unspecified trimester: Secondary | ICD-10-CM

## 2017-12-19 DIAGNOSIS — O09299 Supervision of pregnancy with other poor reproductive or obstetric history, unspecified trimester: Secondary | ICD-10-CM

## 2017-12-19 DIAGNOSIS — Z3A11 11 weeks gestation of pregnancy: Secondary | ICD-10-CM

## 2017-12-19 DIAGNOSIS — F329 Major depressive disorder, single episode, unspecified: Secondary | ICD-10-CM

## 2017-12-19 LAB — POCT URINALYSIS DIPSTICK OB
GLUCOSE, UA: NEGATIVE
POC,PROTEIN,UA: NEGATIVE

## 2017-12-19 NOTE — Progress Notes (Signed)
  Routine Prenatal Care Visit  Subjective  Annette Lawson is a 32 y.o. G2P1001 at [redacted]w[redacted]d being seen today for ongoing prenatal care.  She is currently monitored for the following issues for this high-risk pregnancy and has ADD (attention deficit disorder); Vitamin D deficiency; Right upper quadrant abdominal pain; Multinodular goiter; Supervision of high risk pregnancy, antepartum; Rh negative state in antepartum period; History of cholestasis during pregnancy; Depression affecting pregnancy; and History of postpartum hemorrhage, currently pregnant on their problem list.  ----------------------------------------------------------------------------------- Patient reports no complaints.    . Vag. Bleeding: None.   . Denies leaking of fluid.  ----------------------------------------------------------------------------------- The following portions of the patient's history were reviewed and updated as appropriate: allergies, current medications, past family history, past medical history, past social history, past surgical history and problem list. Problem list updated.   Objective  Blood pressure 122/74, weight 190 lb (86.2 kg), last menstrual period 09/29/2017, unknown if currently breastfeeding. Pregravid weight 190 lb (86.2 kg) Total Weight Gain 0 lb (0 kg) Urinalysis: Urine Protein Negative  Urine Glucose Negative  Fetal Status: Fetal Heart Rate (bpm): present         General:  Alert, oriented and cooperative. Patient is in no acute distress.  Skin: Skin is warm and dry. No rash noted.   Cardiovascular: Normal heart rate noted  Respiratory: Normal respiratory effort, no problems with respiration noted  Abdomen: Soft, gravid, appropriate for gestational age. Pain/Pressure: Absent     Pelvic:  Cervical exam deferred        Extremities: Normal range of motion.     Mental Status: Normal mood and affect. Normal behavior. Normal judgment and thought content.   Assessment   32 y.o. G2P1001 at  [redacted]w[redacted]d by  07/06/2018, by Last Menstrual Period presenting for routine prenatal visit  Plan   pregnancy Problems (from 11/20/17 to present)    Problem Noted Resolved   Supervision of high risk pregnancy, antepartum 11/20/2017 by Will Bonnet, MD No   Rh negative state in antepartum period 11/20/2017 by Will Bonnet, MD No   History of cholestasis during pregnancy 11/20/2017 by Will Bonnet, MD No   Depression affecting pregnancy 11/20/2017 by Will Bonnet, MD No   History of postpartum hemorrhage, currently pregnant 11/20/2017 by Will Bonnet, MD No       Preterm labor symptoms and general obstetric precautions including but not limited to vaginal bleeding, contractions, leaking of fluid and fetal movement were reviewed in detail with the patient. Please refer to After Visit Summary for other counseling recommendations.   Return in about 4 weeks (around 01/16/2018) for 1h gtt and routine prenatal.  Prentice Docker, MD, [place] 12/19/2017 2:41 PM

## 2018-01-09 NOTE — L&D Delivery Note (Signed)
Delivery Note At 6:34 AM a viable female was delivered via Vaginal, Spontaneous (Presentation: ROA).  APGAR: 8, 9; weight 8 lb 1.1 oz (3660 g).   Placenta status: delivered, spontaneously, intact.  Cord: 3VC with the following complications: none.  Cord pH: n/a  Anesthesia:  none Episiotomy: None Lacerations: first degree vaginal Suture Repair: none indicated due to hemostasis Est. Blood Loss (mL): 450  Mom to postpartum.  Baby to Couplet care / Skin to Skin.  Called to see patient who had rapidly progressed from 4 cm to complete and pushing.  Mom pushed to deliver a viable female infant.  The head followed by shoulders, which delivered without difficulty, and the rest of the body.  No nuchal cord noted.  Baby to mom's chest.  Cord clamped and cut after > 1 min delay.  Cord blood obtained.  Placenta delivered spontaneously, intact, with a 3-vessel cord.  First degree vaginal laceration was hemostatic without repair.  All counts correct.  Hemostasis obtained with IV pitocin and fundal massage. EBL 450 mL.     Prentice Docker, MD 06/29/2018, 8:17 AM

## 2018-01-16 ENCOUNTER — Encounter: Payer: Self-pay | Admitting: Advanced Practice Midwife

## 2018-01-16 ENCOUNTER — Other Ambulatory Visit: Payer: BC Managed Care – PPO

## 2018-01-16 ENCOUNTER — Ambulatory Visit (INDEPENDENT_AMBULATORY_CARE_PROVIDER_SITE_OTHER): Payer: BC Managed Care – PPO | Admitting: Advanced Practice Midwife

## 2018-01-16 VITALS — BP 118/72 | Wt 192.0 lb

## 2018-01-16 DIAGNOSIS — O0990 Supervision of high risk pregnancy, unspecified, unspecified trimester: Secondary | ICD-10-CM

## 2018-01-16 DIAGNOSIS — O09292 Supervision of pregnancy with other poor reproductive or obstetric history, second trimester: Secondary | ICD-10-CM

## 2018-01-16 DIAGNOSIS — Z3A15 15 weeks gestation of pregnancy: Secondary | ICD-10-CM

## 2018-01-16 LAB — POCT URINALYSIS DIPSTICK OB
Glucose, UA: NEGATIVE
POC,PROTEIN,UA: NEGATIVE

## 2018-01-16 NOTE — Progress Notes (Signed)
  Routine Prenatal Care Visit  Subjective  Annette Lawson is a 33 y.o. G2P1001 at [redacted]w[redacted]d being seen today for ongoing prenatal care.  She is currently monitored for the following issues for this high-risk pregnancy and has ADD (attention deficit disorder); Vitamin D deficiency; Right upper quadrant abdominal pain; Multinodular goiter; Supervision of high risk pregnancy, antepartum; Rh negative state in antepartum period; History of cholestasis during pregnancy; Depression affecting pregnancy; and History of postpartum hemorrhage, currently pregnant on their problem list.  ----------------------------------------------------------------------------------- Patient reports headache started after drinking glucola and otherwise she has been doing well with this pregnancy.    . Vag. Bleeding: None.   . Denies leaking of fluid.  ----------------------------------------------------------------------------------- The following portions of the patient's history were reviewed and updated as appropriate: allergies, current medications, past family history, past medical history, past social history, past surgical history and problem list. Problem list updated.   Objective  Blood pressure 118/72, weight 192 lb (87.1 kg), last menstrual period 09/29/2017, unknown if currently breastfeeding. Pregravid weight 190 lb (86.2 kg) Total Weight Gain 2 lb (0.907 kg) Urinalysis: Urine Protein    Urine Glucose    Fetal Status: Fetal Heart Rate (bpm): 150         General:  Alert, oriented and cooperative. Patient is in no acute distress.  Skin: Skin is warm and dry. No rash noted.   Cardiovascular: Normal heart rate noted  Respiratory: Normal respiratory effort, no problems with respiration noted  Abdomen: Soft, gravid, appropriate for gestational age. Pain/Pressure: Absent     Pelvic:  Cervical exam deferred        Extremities: Normal range of motion.     Mental Status: Normal mood and affect. Normal behavior.  Normal judgment and thought content.   Assessment   33 y.o. G2P1001 at [redacted]w[redacted]d by  07/06/2018, by Last Menstrual Period presenting for routine prenatal visit  Plan   pregnancy Problems (from 11/20/17 to present)    Problem Noted Resolved   Supervision of high risk pregnancy, antepartum 11/20/2017 by Will Bonnet, MD No   Rh negative state in antepartum period 11/20/2017 by Will Bonnet, MD No   History of cholestasis during pregnancy 11/20/2017 by Will Bonnet, MD No   Depression affecting pregnancy 11/20/2017 by Will Bonnet, MD No   History of postpartum hemorrhage, currently pregnant 11/20/2017 by Will Bonnet, MD No       Preterm labor symptoms and general obstetric precautions including but not limited to vaginal bleeding, contractions, leaking of fluid and fetal movement were reviewed in detail with the patient.   Return in about 4 weeks (around 02/13/2018) for anatomy scan and rob.  Rod Can, CNM 01/16/2018 3:18 PM

## 2018-01-17 LAB — GLUCOSE, 1 HOUR GESTATIONAL: Gestational Diabetes Screen: 95 mg/dL (ref 65–139)

## 2018-02-12 ENCOUNTER — Other Ambulatory Visit: Payer: Self-pay | Admitting: Maternal Newborn

## 2018-02-12 DIAGNOSIS — Z3482 Encounter for supervision of other normal pregnancy, second trimester: Secondary | ICD-10-CM

## 2018-02-13 ENCOUNTER — Ambulatory Visit (INDEPENDENT_AMBULATORY_CARE_PROVIDER_SITE_OTHER): Payer: BC Managed Care – PPO | Admitting: Maternal Newborn

## 2018-02-13 ENCOUNTER — Ambulatory Visit (INDEPENDENT_AMBULATORY_CARE_PROVIDER_SITE_OTHER): Payer: BC Managed Care – PPO

## 2018-02-13 ENCOUNTER — Encounter: Payer: Self-pay | Admitting: Maternal Newborn

## 2018-02-13 VITALS — BP 100/50 | Wt 192.0 lb

## 2018-02-13 DIAGNOSIS — O09292 Supervision of pregnancy with other poor reproductive or obstetric history, second trimester: Secondary | ICD-10-CM

## 2018-02-13 DIAGNOSIS — O0990 Supervision of high risk pregnancy, unspecified, unspecified trimester: Secondary | ICD-10-CM

## 2018-02-13 DIAGNOSIS — Z363 Encounter for antenatal screening for malformations: Secondary | ICD-10-CM | POA: Diagnosis not present

## 2018-02-13 DIAGNOSIS — Z3A19 19 weeks gestation of pregnancy: Secondary | ICD-10-CM

## 2018-02-13 DIAGNOSIS — Z3482 Encounter for supervision of other normal pregnancy, second trimester: Secondary | ICD-10-CM

## 2018-02-13 LAB — POCT URINALYSIS DIPSTICK OB
GLUCOSE, UA: NEGATIVE
POC,PROTEIN,UA: NEGATIVE

## 2018-02-13 NOTE — Progress Notes (Signed)
    Routine Prenatal Care Visit  Subjective  Annette Lawson is a 33 y.o. G2P1001 at [redacted]w[redacted]d being seen today for ongoing prenatal care.  She is currently monitored for the following issues for this high-risk pregnancy and has ADD (attention deficit disorder); Vitamin D deficiency; Right upper quadrant abdominal pain; Multinodular goiter; Supervision of high risk pregnancy, antepartum; Rh negative state in antepartum period; History of cholestasis during pregnancy; Depression affecting pregnancy; and History of postpartum hemorrhage, currently pregnant on their problem list.  ----------------------------------------------------------------------------------- Patient reports no complaints.   Vag. Bleeding: None.  Movement: Present. No leaking of fluid.  ----------------------------------------------------------------------------------- The following portions of the patient's history were reviewed and updated as appropriate: allergies, current medications, past family history, past medical history, past social history, past surgical history and problem list. Problem list updated.  Objective  Blood pressure (!) 100/50, weight 192 lb (87.1 kg), last menstrual period 09/29/2017. Pregravid weight 190 lb (86.2 kg) Total Weight Gain 2 lb (0.907 kg) Body mass index is 30.99 kg/m.   Urinalysis: Urine dipstick shows negative for glucose, protein.  Fetal Status: Fetal Heart Rate (bpm): 148   Movement: Present     General:  Alert, oriented and cooperative. Patient is in no acute distress.  Skin: Skin is warm and dry. No rash noted.   Cardiovascular: Normal heart rate noted  Respiratory: Normal respiratory effort, no problems with respiration noted  Abdomen: Soft, gravid, appropriate for gestational age. Pain/Pressure: Absent     Pelvic:  Cervical exam deferred        Extremities: Normal range of motion.     Mental Status: Normal mood and affect. Normal behavior. Normal judgment and thought content.     Assessment   33 y.o. G2P1001 at [redacted]w[redacted]d, EDD 07/06/2018 by Last Menstrual Period presenting for a routine prenatal visit.  Plan   pregnancy Problems (from 11/20/17 to present)    Problem Noted Resolved   Supervision of high risk pregnancy, antepartum 11/20/2017 by Will Bonnet, MD No   Rh negative state in antepartum period 11/20/2017 by Will Bonnet, MD No   History of cholestasis during pregnancy 11/20/2017 by Will Bonnet, MD No   Depression affecting pregnancy 11/20/2017 by Will Bonnet, MD No   History of postpartum hemorrhage, currently pregnant 11/20/2017 by Will Bonnet, MD No    Anatomy scan complete and normal today.  Please refer to After Visit Summary for other counseling recommendations.   Return in about 4 weeks (around 03/13/2018) for ROB.  [name], CNM 02/13/2018

## 2018-02-13 NOTE — Progress Notes (Signed)
ROB/Anatomy Scan today- no concerns

## 2018-02-13 NOTE — Patient Instructions (Signed)

## 2018-03-14 ENCOUNTER — Encounter: Payer: Self-pay | Admitting: Certified Nurse Midwife

## 2018-03-14 ENCOUNTER — Ambulatory Visit (INDEPENDENT_AMBULATORY_CARE_PROVIDER_SITE_OTHER): Payer: BC Managed Care – PPO | Admitting: Certified Nurse Midwife

## 2018-03-14 VITALS — BP 110/62 | Wt 200.0 lb

## 2018-03-14 DIAGNOSIS — O26892 Other specified pregnancy related conditions, second trimester: Secondary | ICD-10-CM

## 2018-03-14 DIAGNOSIS — Z6791 Unspecified blood type, Rh negative: Secondary | ICD-10-CM

## 2018-03-14 DIAGNOSIS — Z131 Encounter for screening for diabetes mellitus: Secondary | ICD-10-CM

## 2018-03-14 DIAGNOSIS — O26899 Other specified pregnancy related conditions, unspecified trimester: Secondary | ICD-10-CM

## 2018-03-14 DIAGNOSIS — Z113 Encounter for screening for infections with a predominantly sexual mode of transmission: Secondary | ICD-10-CM

## 2018-03-14 DIAGNOSIS — Z3A23 23 weeks gestation of pregnancy: Secondary | ICD-10-CM

## 2018-03-14 DIAGNOSIS — O0990 Supervision of high risk pregnancy, unspecified, unspecified trimester: Secondary | ICD-10-CM

## 2018-03-14 LAB — POCT URINALYSIS DIPSTICK OB: Glucose, UA: NEGATIVE

## 2018-03-14 NOTE — Progress Notes (Signed)
ROB at 23wk5days: Doing OK. Having pain in her pubic bone area when she stands or walks for a long time. Encouraged maternity support garment. Baby active.  Desires to breast feed. Fed first baby for 3-6 months, had some difficulty with milk supply. ROB/ Rhogam/ 28 week labs in 4 weeks  Dalia Heading, [place]

## 2018-03-14 NOTE — Progress Notes (Signed)
ROB

## 2018-04-11 ENCOUNTER — Encounter: Payer: Self-pay | Admitting: Maternal Newborn

## 2018-04-11 ENCOUNTER — Other Ambulatory Visit: Payer: Self-pay

## 2018-04-11 ENCOUNTER — Other Ambulatory Visit: Payer: Self-pay | Admitting: Certified Nurse Midwife

## 2018-04-11 ENCOUNTER — Ambulatory Visit (INDEPENDENT_AMBULATORY_CARE_PROVIDER_SITE_OTHER): Payer: BC Managed Care – PPO | Admitting: Maternal Newborn

## 2018-04-11 ENCOUNTER — Other Ambulatory Visit: Payer: BC Managed Care – PPO

## 2018-04-11 VITALS — BP 110/74 | Wt 205.0 lb

## 2018-04-11 DIAGNOSIS — Z3A27 27 weeks gestation of pregnancy: Secondary | ICD-10-CM

## 2018-04-11 DIAGNOSIS — O26892 Other specified pregnancy related conditions, second trimester: Secondary | ICD-10-CM

## 2018-04-11 DIAGNOSIS — Z6791 Unspecified blood type, Rh negative: Secondary | ICD-10-CM

## 2018-04-11 DIAGNOSIS — O0990 Supervision of high risk pregnancy, unspecified, unspecified trimester: Secondary | ICD-10-CM

## 2018-04-11 DIAGNOSIS — O26899 Other specified pregnancy related conditions, unspecified trimester: Secondary | ICD-10-CM

## 2018-04-11 LAB — POCT URINALYSIS DIPSTICK OB
Glucose, UA: NEGATIVE
POC,PROTEIN,UA: NEGATIVE

## 2018-04-11 MED ORDER — RHO D IMMUNE GLOBULIN 1500 UNIT/2ML IJ SOSY
300.0000 ug | PREFILLED_SYRINGE | Freq: Once | INTRAMUSCULAR | Status: AC
  Administered 2018-04-11: 300 ug via INTRAMUSCULAR

## 2018-04-11 NOTE — Progress Notes (Signed)
    Routine Prenatal Care Visit  Subjective  Annette Lawson is a 33 y.o. G2P1001 at [redacted]w[redacted]d being seen today for ongoing prenatal care.  She is currently monitored for the following issues for this high-risk pregnancy and has ADD (attention deficit disorder); Vitamin D deficiency; Right upper quadrant abdominal pain; Multinodular goiter; Supervision of high risk pregnancy, antepartum; Rh negative state in antepartum period; History of cholestasis during pregnancy; Depression affecting pregnancy; and History of postpartum hemorrhage, currently pregnant on their problem list.  ----------------------------------------------------------------------------------- Patient reports no complaints.   Contractions: Not present. Vag. Bleeding: None.  Movement: Present. No leaking of fluid.  ----------------------------------------------------------------------------------- The following portions of the patient's history were reviewed and updated as appropriate: allergies, current medications, past family history, past medical history, past social history, past surgical history and problem list. Problem list updated.  Objective  Blood pressure 110/74, weight 205 lb (93 kg), last menstrual period 09/29/2017, unknown if currently breastfeeding. Pregravid weight 190 lb (86.2 kg) Total Weight Gain 15 lb (6.804 kg) Body mass index is 33.09 kg/m.   Urinalysis: Urine dipstick shows negative for glucose, protein.  Fetal Status: Fetal Heart Rate (bpm): 150 Fundal Height: 27 cm Movement: Present     General:  Alert, oriented and cooperative. Patient is in no acute distress.  Skin: Skin is warm and dry. No rash noted.   Cardiovascular: Normal heart rate noted  Respiratory: Normal respiratory effort, no problems with respiration noted  Abdomen: Soft, gravid, appropriate for gestational age. Pain/Pressure: Absent     Pelvic:  Cervical exam deferred        Extremities: Normal range of motion.  Edema: None  Mental  Status: Normal mood and affect. Normal behavior. Normal judgment and thought content.    Assessment   33 y.o. G2P1001 at [redacted]w[redacted]d, EDD 07/06/2018 by Last Menstrual Period presenting for a routine prenatal visit.  Plan   pregnancy Problems (from 11/20/17 to present)    Problem Noted Resolved   Rh negative state in antepartum period 11/20/2017 by Will Bonnet, MD No   History of cholestasis during pregnancy 11/20/2017 by Will Bonnet, MD No   Depression affecting pregnancy 11/20/2017 by Will Bonnet, MD No   History of postpartum hemorrhage, currently pregnant 11/20/2017 by Will Bonnet, MD No      GTT and labs, RhoGAM received today.  Discussed spacing of visits due to COVID-19 precautions; encouraged to stay in touch via MyChart/telephone for any concerns or questions. Written FAQ and more information about visit scheduling available in the After Visit Summary.   Return in about 4 weeks (around 05/09/2018) for [name].  [name], CNM 04/11/2018

## 2018-04-11 NOTE — Patient Instructions (Signed)
   Given the current COVID-19 pandemic, our practice is making changes in how we are providing care to our patients. We are limiting in-person visits for the safety of all of our patients.   As a practice, we have met to discuss the best way to minimize visits, but still provide excellent care to our expecting mothers.  We have decided on the following visit structure for low-risk pregnancies.  Initial Pregnancy visit will be conducted as a telephone or web visit.  Between 10-14 weeks  there will be one in-person visit for an ultrasound, lab work, and genetic screening. 20 weeks in-person visit with an anatomy ultrasound  28 weeks in-person office visit for a 1-hour glucose test and a TDAP vaccination 32 weeks in-person office visit 34 weeks telephone visit 36 weeks in-person office visit for GBS, chlamydia, and gonorrhea testing 38 weeks in-person office visit 40 weeks in-person office visit  Understandably, some patients will require more visits than what is outlined above. Additional visits will be determined on a case-by-case basis.   We will, as always, be available for emergencies or to address concerns that might arise between in-person visits. We ask that you allow us the opportunity to address any concerns over the phone or through a virtual visit first. We will be available to return your phone calls throughout the day.   If you are able to purchase a scale, a blood pressure machine, and a home fetal doppler visits could be limited further. This will help decrease your exposure risks, but these purchases are not a necessity.   Things seem to change daily and there is the possibility that this structure could change, please be patient as we adapt to a new way of caring for patients.   Thank you for trusting us with your prenatal care. Our practice values you and looks forward to providing you with excellent care.   Sincerely,   [facility], [facility]     COVID-19 and Your Pregnancy FAQ  How can I prevent infection with COVID-19 during my pregnancy? Social distancing is key. Please limit any interactions in public. Try and work from home if possible. Frequently wash your hands after touching possibly contaminated surfaces. Avoid touching your face.  Minimize trips to the store. Consider online ordering when possible.   Should I wear a mask? Masks should only be worn by those experiencing symptoms of COVID-19 or those with confirmed COVID-19 when they are in public or around other individuals.  What are the symptoms of COVID-19? Fever (greater than 100.4 F), dry cough, shortness of breath.  Am I more at risk for COVID-19 since I am pregnant? There is not currently data showing that pregnant women are more adversely impacted by COVID-19 than the general population. However, we know that pregnant women tend to have worse respiratory complications from similar diseases such as the flu and SARS and for this reason should be considered an at-risk population.  What do I do if I am experiencing the symptoms of COVID-19? Testing is being limited because of test availability. If you are experiencing symptoms you should quarantine yourself, and the members of your family, for at least 2 weeks at home.   Please visit this website for more information: https://www.cdc.gov/coronavirus/2019-ncov/if-you-are-sick/steps-when-sick.html  When should I go to the Emergency Room? Please go to the emergency room if you are experiencing ANY of these symptoms*:  1.    Difficulty breathing or shortness of breath 2.    Persistent   pressure in the chest 3.    Confusion or difficulty being aroused (or awakened) 4.    Bluish lips or face  *This list is not all inclusive. Please consult our office for any other symptoms that are severe or concerning.  What do I do if I am having difficulty breathing? You should go to the Emergency Room for evaluation. At  this time they have a tent set up for evaluating patients with COVID-19 symptoms.   How will my prenatal care be different because of the COVID-19 pandemic? It has been recommended to reduce the frequency of face-to-face visits and use resources such as telephone and virtual visits when possible. Using a scale, blood pressure machine and fetal doppler at home can further help reduce face-to-face visits. You will be provided with additional information on this topic.  We ask that you come to your visits alone to minimize potential exposures to  COVID-19.  How can I receive childbirth education? At this time in-person classes have been cancelled. You can register for online childbirth education, breastfeeding, and newborn care classes.  Please visit:  [email or web address] for more information  How will my hospital birth experience be different? The hospital is currently limiting visitors. This means that while you are in labor you can only have one person at the hospital with you. Additional family members will not be allowed to wait in the building or outside your room. Your one support person can be the father of the baby, a relative, a doula, or a friend. Once one support person is designated that person will wear a band. This band cannot be shared with multiple people.  How long will I stay in the hospital for after giving birth? It is also recommended that discharge home be expedited during the COVID-19 outbreak. This means staying for 1 day after a vaginal delivery and 2 days after a cesarean section.  What if I have COVID-19 and I am in labor? We ask that you wear a mask while on labor and delivery. We will try and accommodate you being placed in a room that is capable of filtering the air. Please call ahead if you are in labor and on your way to the hospital. The phone number for labor and delivery at [facility] is [phone].  If I have COVID-19 when my baby  is born how can I prevent my baby from contracting COVID-19? This is an issue that will have to be discussed on a case-by-case basis. Current recommendations suggest providing separate isolation rooms for both the mother and new infant as well as limiting visitors. However, there are practical challenges to this recommendation. The situation will assuredly change and decisions will be influenced by the desires of the mother and availability of space.  Some suggestions are the use of a curtain or physical barrier between mom and infant, hand hygiene, mom wearing a mask, or 6 feet of spacing between a mom and infant.   Can I breastfeed during the COVID-19 pandemic?   Yes, breastfeeding is encouraged.  Can I breastfeed if I have COVID-19? Yes. Covid-19 has not been found in breast milk. This means you cannot give COVID-19 to your child through breast milk. Breast feeding will also help pass antibodies to fight infection to your baby.   What precautions should I take when breastfeeding if I have COVID-19? If a mother and newborn do room-in and the mother wishes to feed at the breast, she should put on a  facemask and practice hand hygiene before each feeding.  What precautions should I take when pumping if I have COVID-19? Prior to expressing breast milk, mothers should practice hand hygiene. After each pumping session, all parts that come into contact with breast milk should be thoroughly washed and the entire pump should be appropriately disinfected per the manufacturer's instructions. This expressed breast milk should be fed to the newborn by a healthy caregiver.  What if I am pregnant and work in healthcare? Based on limited data regarding COVID-19 and pregnancy, ACOG currently does not propose creating additional restrictions on pregnant health care personnel because of COVID-19 alone. Pregnant women do not appear to be at higher risk of severe disease related to COVID-19. Pregnant health care  personnel should follow CDC risk assessment and infection control guidelines for health care personnel exposed to patients with suspected or confirmed COVID-19. Adherence to recommended infection prevention and control practices is an important part of protecting all health care personnel in health care settings.    Information on COVID-19 in pregnancy is very limited; however, facilities may want to consider limiting exposure of pregnant health care personnel to patients with confirmed or suspected COVID-19 infection, especially during higher-risk procedures (eg, aerosol-generating procedures), if feasible, based on staffing availability.

## 2018-04-11 NOTE — Progress Notes (Signed)
ROB/ 1 hr GTT/Rhog[place] today- no concerns

## 2018-04-12 LAB — 28 WEEKS RH-PANEL
Antibody Screen: NEGATIVE
Basophils Absolute: 0 10*3/uL (ref 0.0–0.2)
Basos: 0 %
EOS (ABSOLUTE): 0.1 10*3/uL (ref 0.0–0.4)
Eos: 1 %
Gestational Diabetes Screen: 105 mg/dL (ref 65–139)
HIV Screen 4th Generation wRfx: NONREACTIVE
Hematocrit: 33.9 % — ABNORMAL LOW (ref 34.0–46.6)
Hemoglobin: 12 g/dL (ref 11.1–15.9)
Immature Grans (Abs): 0.1 10*3/uL (ref 0.0–0.1)
Immature Granulocytes: 1 %
Lymphocytes Absolute: 1.8 10*3/uL (ref 0.7–3.1)
Lymphs: 14 %
MCH: 32 pg (ref 26.6–33.0)
MCHC: 35.4 g/dL (ref 31.5–35.7)
MCV: 90 fL (ref 79–97)
Monocytes Absolute: 0.7 10*3/uL (ref 0.1–0.9)
Monocytes: 6 %
Neutrophils Absolute: 9.9 10*3/uL — ABNORMAL HIGH (ref 1.4–7.0)
Neutrophils: 78 %
Platelets: 323 10*3/uL (ref 150–450)
RBC: 3.75 x10E6/uL — ABNORMAL LOW (ref 3.77–5.28)
RDW: 12 % (ref 11.7–15.4)
RPR Ser Ql: NONREACTIVE
WBC: 12.6 10*3/uL — ABNORMAL HIGH (ref 3.4–10.8)

## 2018-05-08 ENCOUNTER — Encounter: Payer: Self-pay | Admitting: Obstetrics & Gynecology

## 2018-05-08 ENCOUNTER — Other Ambulatory Visit: Payer: Self-pay

## 2018-05-08 ENCOUNTER — Ambulatory Visit (INDEPENDENT_AMBULATORY_CARE_PROVIDER_SITE_OTHER): Payer: BC Managed Care – PPO | Admitting: Obstetrics & Gynecology

## 2018-05-08 DIAGNOSIS — O09299 Supervision of pregnancy with other poor reproductive or obstetric history, unspecified trimester: Secondary | ICD-10-CM

## 2018-05-08 DIAGNOSIS — Z8719 Personal history of other diseases of the digestive system: Secondary | ICD-10-CM

## 2018-05-08 DIAGNOSIS — O09293 Supervision of pregnancy with other poor reproductive or obstetric history, third trimester: Secondary | ICD-10-CM

## 2018-05-08 DIAGNOSIS — O0990 Supervision of high risk pregnancy, unspecified, unspecified trimester: Secondary | ICD-10-CM

## 2018-05-08 DIAGNOSIS — Z8759 Personal history of other complications of pregnancy, childbirth and the puerperium: Secondary | ICD-10-CM

## 2018-05-08 DIAGNOSIS — Z3A31 31 weeks gestation of pregnancy: Secondary | ICD-10-CM

## 2018-05-08 NOTE — Progress Notes (Signed)
Virtual Visit via Telephone Note  I connected with patient on 05/08/18 at  1:30 PM EDT by telephone and verified that I am speaking with the correct person using two identifiers.   I discussed the limitations, risks, security and privacy concerns of performing an evaluation and management service by telephone and the availability of in person appointments. I also discussed with the patient that there may be a patient responsible charge related to this service. The patient expressed understanding and agreed to proceed.  The patient was at home I spoke with the patient from my  office  Annette Lawson is a 33 y.o. G2P1001 at [redacted]w[redacted]d being seen today for ongoing prenatal care.  She is currently monitored for the following issues for this low-risk pregnancy and has ADD (attention deficit disorder); Vitamin D deficiency; Right upper quadrant abdominal pain; Multinodular goiter; Supervision of high risk pregnancy, antepartum; Rh negative state in antepartum period; History of cholestasis during pregnancy; Depression affecting pregnancy; and History of postpartum hemorrhage, currently pregnant on their problem list.  ----------------------------------------------------------------------------------- Patient reports one episode of ctxs for about 2 hours.   Denies pain, VB, leaking of fluid.  ----------------------------------------------------------------------------------- The following portions of the patient's history were reviewed and updated as appropriate: allergies, current medications, past family history, past medical history, past social history, past surgical history and problem list. Problem list updated.   Objective  Last menstrual period 09/29/2017, unknown if currently breastfeeding. Pregravid weight 190 lb (86.2 kg) Total Weight Gain 15 lb (6.804 kg)  Physical Exam could not be performed. Because of the COVID-19 outbreak this visit was performed over the phone and not in person.    Assessment   33 y.o. G2P1001 at [redacted]w[redacted]d by  07/06/2018, by Last Menstrual Period presenting for routine prenatal visit  Plan   pregnancy Problems (from 11/20/17 to present)    Problem Noted Resolved   Rh negative state in antepartum period 11/20/2017 by Will Bonnet, MD No   History of cholestasis during pregnancy 11/20/2017 by Will Bonnet, MD No   Depression affecting pregnancy 11/20/2017 by Will Bonnet, MD No   History of postpartum hemorrhage, currently pregnant 11/20/2017 by Will Bonnet, MD No      [facility] Prenatal Labs  Dating L=7 Blood type: O/Negative/-- (11/12 1438)   Genetic Screen declines Antibody:Negative (11/12 1438)  Anatomic [place] Complete and normal 2/5 Rubella: 2.48 (11/12 1438) Varicella: Immune  GTT Early: 95  Third trimester: normal RPR: Non Reactive (11/12 1438)   Rhogam Received 04/11/2018 HBsAg: Negative (11/12 1438)   TDaP vaccine        Flu Shot:declined HIV: Non Reactive (11/12 1438)   Baby Food        Breast                        GBS:   Contraception        BTL, Vasec Pap: 11/20/2017, NILM/HPV Negative  CBB  No   CS/VBAC N/A   Support Person Husband    Counseled as to contraception and she expresses desire for permanent sterilization, either herself or her partner after delivery.  If CS happens, she would prefer BTL at that time.  Gestational age appropriate obstetric precautions including but not limited to vaginal bleeding, contractions, leaking of fluid and fetal movement were reviewed in detail with the patient.     Follow Up Instructions: 2 weeks   I discussed the assessment and treatment plan with the patient. The  patient was provided an opportunity to ask questions and all were answered. The patient agreed with the plan and demonstrated an understanding of the instructions.   The patient was advised to call back or seek an in-person evaluation if the symptoms worsen or if the condition fails to improve as  anticipated.  I provided 10 minutes of non-face-to-face time during this encounter.  Return in about 2 weeks (around 05/22/2018) for ROB IN OFFICE.  Annette Applebaum, MD [facility], [facility] 05/08/2018 1:43 PM

## 2018-05-21 ENCOUNTER — Ambulatory Visit (INDEPENDENT_AMBULATORY_CARE_PROVIDER_SITE_OTHER): Payer: BC Managed Care – PPO | Admitting: Obstetrics & Gynecology

## 2018-05-21 ENCOUNTER — Other Ambulatory Visit: Payer: Self-pay

## 2018-05-21 ENCOUNTER — Encounter: Payer: Self-pay | Admitting: Obstetrics & Gynecology

## 2018-05-21 VITALS — BP 120/70 | Wt 213.0 lb

## 2018-05-21 DIAGNOSIS — O0990 Supervision of high risk pregnancy, unspecified, unspecified trimester: Secondary | ICD-10-CM

## 2018-05-21 DIAGNOSIS — Z6791 Unspecified blood type, Rh negative: Secondary | ICD-10-CM

## 2018-05-21 DIAGNOSIS — O09299 Supervision of pregnancy with other poor reproductive or obstetric history, unspecified trimester: Secondary | ICD-10-CM

## 2018-05-21 DIAGNOSIS — O99343 Other mental disorders complicating pregnancy, third trimester: Secondary | ICD-10-CM

## 2018-05-21 DIAGNOSIS — Z8759 Personal history of other complications of pregnancy, childbirth and the puerperium: Secondary | ICD-10-CM

## 2018-05-21 DIAGNOSIS — O26893 Other specified pregnancy related conditions, third trimester: Secondary | ICD-10-CM

## 2018-05-21 DIAGNOSIS — Z23 Encounter for immunization: Secondary | ICD-10-CM

## 2018-05-21 DIAGNOSIS — Z8719 Personal history of other diseases of the digestive system: Secondary | ICD-10-CM

## 2018-05-21 DIAGNOSIS — F329 Major depressive disorder, single episode, unspecified: Secondary | ICD-10-CM

## 2018-05-21 DIAGNOSIS — O09293 Supervision of pregnancy with other poor reproductive or obstetric history, third trimester: Secondary | ICD-10-CM

## 2018-05-21 DIAGNOSIS — O26899 Other specified pregnancy related conditions, unspecified trimester: Secondary | ICD-10-CM

## 2018-05-21 DIAGNOSIS — Z3A33 33 weeks gestation of pregnancy: Secondary | ICD-10-CM

## 2018-05-21 DIAGNOSIS — O99340 Other mental disorders complicating pregnancy, unspecified trimester: Secondary | ICD-10-CM

## 2018-05-21 LAB — POCT URINALYSIS DIPSTICK OB
Glucose, UA: NEGATIVE
POC,PROTEIN,UA: NEGATIVE

## 2018-05-21 NOTE — Progress Notes (Signed)
  Subjective  Fetal Movement? yes Contractions? no Leaking Fluid? no Vaginal Bleeding? no New onset itching    Cholestasis last pregnancy Some pressure like baby dropped  Objective  BP 120/70   Wt 213 lb (96.6 kg)   LMP 09/29/2017   BMI 34.38 kg/m  General: NAD Pumonary: no increased work of breathing Abdomen: gravid, non-tender Extremities: no edema Psychiatric: mood appropriate, affect full SVE 1/50/-3, VTX Assessment  33 y.o. G2P1001 at [redacted]w[redacted]d by  07/06/2018, by Last Menstrual Period presenting for routine prenatal visit  Plan   Problem List Items Addressed This Visit    Supervision of high risk pregnancy, antepartum   Rh negative state in antepartum period    Rhogam UTD   History of cholestasis during pregnancy   Relevant Orders   Bile acids, total to be checked today   Depression affecting pregnancy   History of postpartum hemorrhage, currently pregnant   [redacted] weeks gestation of pregnancy    -  Primary   Relevant Orders   PNV, [place], PTL Precautions TDaP today POC Urinalysis Dipstick OB (Completed)      [facility] Prenatal Labs  Dating L=7 Blood type: O/Negative/-- (11/12 1438)   Genetic Screen declines Antibody:Negative (11/12 1438)  Anatomic [place] Complete and normal 2/5 Rubella: 2.48 (11/12 1438) Varicella: Immune  GTT Early: 95  Third trimester: normal RPR: Non Reactive (11/12 1438)   Rhogam Received 04/11/2018 HBsAg: Negative (11/12 1438)   TDaP vaccine 05/21/18  Flu Shot:declined HIV: Non Reactive (11/12 1438)   Baby Food        Breast                        GBS: pending  Contraception        BTL, Vasec Pap: 11/20/2017, NILM/HPV Negative  CBB  No   CS/VBAC N/A   Support Person Husband      Barnett Applebaum, MD, [facility], [facility] 05/21/2018  2:04 PM

## 2018-05-21 NOTE — Patient Instructions (Signed)
Braxton Hicks Contractions Contractions of the uterus can occur throughout pregnancy, but they are not always a sign that you are in labor. You may have practice contractions called Braxton Hicks contractions. These false labor contractions are sometimes confused with true labor. What are Braxton Hicks contractions? Braxton Hicks contractions are tightening movements that occur in the muscles of the uterus before labor. Unlike true labor contractions, these contractions do not result in opening (dilation) and thinning of the cervix. Toward the end of pregnancy (32-34 weeks), Braxton Hicks contractions can happen more often and may become stronger. These contractions are sometimes difficult to tell apart from true labor because they can be very uncomfortable. You should not feel embarrassed if you go to the hospital with false labor. Sometimes, the only way to tell if you are in true labor is for your health care provider to look for changes in the cervix. The health care provider will do a physical exam and may monitor your contractions. If you are not in true labor, the exam should show that your cervix is not dilating and your water has not broken. If there are no other health problems associated with your pregnancy, it is completely safe for you to be sent home with false labor. You may continue to have Braxton Hicks contractions until you go into true labor. How to tell the difference between true labor and false labor True labor  Contractions last 30-70 seconds.  Contractions become very regular.  Discomfort is usually felt in the top of the uterus, and it spreads to the lower abdomen and low back.  Contractions do not go away with walking.  Contractions usually become more intense and increase in frequency.  The cervix dilates and gets thinner. False labor  Contractions are usually shorter and not as strong as true labor contractions.  Contractions are usually irregular.  Contractions  are often felt in the front of the lower abdomen and in the groin.  Contractions may go away when you walk around or change positions while lying down.  Contractions get weaker and are shorter-lasting as time goes on.  The cervix usually does not dilate or become thin. Follow these instructions at home:   Take over-the-counter and prescription medicines only as told by your health care provider.  Keep up with your usual exercises and follow other instructions from your health care provider.  Eat and drink lightly if you think you are going into labor.  If Braxton Hicks contractions are making you uncomfortable: ? Change your position from lying down or resting to walking, or change from walking to resting. ? Sit and rest in a tub of warm water. ? Drink enough fluid to keep your urine pale yellow. Dehydration may cause these contractions. ? Do slow and deep breathing several times an hour.  Keep all follow-up prenatal visits as told by your health care provider. This is important. Contact a health care provider if:  You have a fever.  You have continuous pain in your abdomen. Get help right away if:  Your contractions become stronger, more regular, and closer together.  You have fluid leaking or gushing from your vagina.  You pass blood-tinged mucus (bloody show).  You have bleeding from your vagina.  You have low back pain that you never had before.  You feel your baby's head pushing down and causing pelvic pressure.  Your baby is not moving inside you as much as it used to. Summary  Contractions that occur before labor are   called Braxton Hicks contractions, false labor, or practice contractions.  Braxton Hicks contractions are usually shorter, weaker, farther apart, and less regular than true labor contractions. True labor contractions usually become progressively stronger and regular, and they become more frequent.  Manage discomfort from Braxton Hicks contractions  by changing position, resting in a warm bath, drinking plenty of water, or practicing deep breathing. This information is not intended to replace advice given to you by your health care provider. Make sure you discuss any questions you have with your health care provider. Document Released: 05/11/2016 Document Revised: 10/10/2016 Document Reviewed: 05/11/2016 Elsevier Interactive Patient Education  2019 [employer]  

## 2018-05-21 NOTE — Addendum Note (Signed)
Addended by: Quintella Baton D on: 05/21/2018 02:15 PM   Modules accepted: Orders

## 2018-05-22 LAB — BILE ACIDS, TOTAL: Bile Acids Total: 1.9 umol/L (ref 0.0–10.0)

## 2018-06-01 ENCOUNTER — Observation Stay
Admission: EM | Admit: 2018-06-01 | Discharge: 2018-06-01 | Disposition: A | Discharge status: Home / Self Care | Payer: BC Managed Care – PPO | Attending: Obstetrics & Gynecology | Admitting: Obstetrics & Gynecology

## 2018-06-01 ENCOUNTER — Other Ambulatory Visit: Payer: Self-pay

## 2018-06-01 DIAGNOSIS — O26899 Other specified pregnancy related conditions, unspecified trimester: Secondary | ICD-10-CM

## 2018-06-01 DIAGNOSIS — O4703 False labor before 37 completed weeks of gestation, third trimester: Secondary | ICD-10-CM

## 2018-06-01 DIAGNOSIS — Z79899 Other long term (current) drug therapy: Secondary | ICD-10-CM | POA: Diagnosis not present

## 2018-06-01 DIAGNOSIS — Z8719 Personal history of other diseases of the digestive system: Secondary | ICD-10-CM

## 2018-06-01 DIAGNOSIS — Z3A35 35 weeks gestation of pregnancy: Secondary | ICD-10-CM | POA: Diagnosis not present

## 2018-06-01 DIAGNOSIS — R109 Unspecified abdominal pain: Secondary | ICD-10-CM | POA: Insufficient documentation

## 2018-06-01 DIAGNOSIS — O09299 Supervision of pregnancy with other poor reproductive or obstetric history, unspecified trimester: Secondary | ICD-10-CM

## 2018-06-01 DIAGNOSIS — F329 Major depressive disorder, single episode, unspecified: Secondary | ICD-10-CM | POA: Insufficient documentation

## 2018-06-01 DIAGNOSIS — O99333 Smoking (tobacco) complicating pregnancy, third trimester: Secondary | ICD-10-CM | POA: Diagnosis not present

## 2018-06-01 DIAGNOSIS — O99340 Other mental disorders complicating pregnancy, unspecified trimester: Secondary | ICD-10-CM

## 2018-06-01 DIAGNOSIS — F32A Depression, unspecified: Secondary | ICD-10-CM

## 2018-06-01 DIAGNOSIS — O26893 Other specified pregnancy related conditions, third trimester: Secondary | ICD-10-CM | POA: Diagnosis present

## 2018-06-01 DIAGNOSIS — Z6791 Unspecified blood type, Rh negative: Secondary | ICD-10-CM

## 2018-06-01 DIAGNOSIS — M545 Low back pain: Secondary | ICD-10-CM | POA: Diagnosis not present

## 2018-06-01 DIAGNOSIS — O99343 Other mental disorders complicating pregnancy, third trimester: Secondary | ICD-10-CM | POA: Diagnosis not present

## 2018-06-01 LAB — URINALYSIS, COMPLETE (UACMP) WITH MICROSCOPIC
Bacteria, UA: NONE SEEN
Bilirubin Urine: NEGATIVE
Glucose, UA: NEGATIVE mg/dL
Hgb urine dipstick: NEGATIVE
Ketones, ur: 20 mg/dL — AB
Leukocytes,Ua: NEGATIVE
Nitrite: NEGATIVE
Protein, ur: 100 mg/dL — AB
Specific Gravity, Urine: 1.021 (ref 1.005–1.030)
pH: 6 (ref 5.0–8.0)

## 2018-06-01 MED ORDER — LIDOCAINE HCL (PF) 1 % IJ SOLN: 30.0000 mL | INTRAMUSCULAR | Status: DC | PRN

## 2018-06-01 MED ORDER — LACTATED RINGERS IV BOLUS
500.0000 mL | Freq: Once | INTRAVENOUS | Status: AC
  Administered 2018-06-01: 500 mL via INTRAVENOUS

## 2018-06-01 MED ORDER — ACETAMINOPHEN 500 MG PO TABS
1000.0000 mg | ORAL_TABLET | ORAL | Status: DC | PRN
  Administered 2018-06-01: 17:00:00 1000 mg via ORAL
  Filled 2018-06-01: qty 2

## 2018-06-01 MED ORDER — LACTATED RINGERS IV SOLN
INTRAVENOUS | Status: DC
  Administered 2018-06-01: 16:00:00 via INTRAVENOUS

## 2018-06-01 MED ORDER — TERBUTALINE SULFATE 1 MG/ML IJ SOLN
0.2500 mg | Freq: Once | INTRAMUSCULAR | Status: AC
  Administered 2018-06-01: 0.25 mg via SUBCUTANEOUS
  Filled 2018-06-01: qty 1

## 2018-06-01 NOTE — Discharge Summary (Signed)
Physician Final Progress Note  Patient ID: Annette Lawson MRN: [id number] DOB/AGE: May 02, 1985 33 y.o.  Admit date: 06/01/2018 Admitting provider: Gae Dry, MD Discharge date: 06/01/2018  Admission Diagnoses: Preterm contractions   Discharge Diagnoses: None  History of Present Illness: The patient is a 33 y.o. female G2P1001 at [redacted]w[redacted]d who presented for back and abdominal pain since around 1800 last night. She was unable to sleep due to the pain. She rates it 4/10. Baby has been moving well. No vaginal bleeding or loss of fluid.   Review of Systems: Review of systems negative unless otherwise noted in HPI.   Past Medical History:  Diagnosis Date  . ADD (attention deficit disorder)   . Cholestasis during pregnancy   . Depression   . Vaginal Pap smear, abnormal     Past Surgical History:  Procedure Laterality Date  . WISDOM TOOTH EXTRACTION      No current facility-administered medications on file prior to encounter.    Current Outpatient Medications on File Prior to Encounter  Medication Sig Dispense Refill  . acetaminophen (TYLENOL) 500 MG tablet Take 500 mg by mouth every 6 (six) hours as needed for mild pain or headache.    . amphetamine-dextroamphetamine (ADDERALL) 10 MG tablet Take 10 mg by mouth 3 (three) times daily.    . benzocaine-Menthol (DERMOPLAST) 20-0.5 % AERO Apply 1 application topically as needed for irritation (perineal discomfort). (Patient not taking: Reported on 04/11/2018)    . Prenatal Vit-Fe Fumarate-FA (PRENATAL MULTIVITAMIN) TABS tablet Take 1 tablet by mouth daily at 12 noon.      No Known Allergies  Social History   Socioeconomic History  . Marital status: Married    Spouse name: Not on file  . Number of children: Not on file  . Years of education: Not on file  . Highest education level: Not on file  Occupational History  . Not on file  Social Needs  . Financial resource strain: Not on file  . Food insecurity:    Worry: Not on file     Inability: Not on file  . Transportation needs:    Medical: Not on file    Non-medical: Not on file  Tobacco Use  . Smoking status: Current Some Day Smoker  . Smokeless tobacco: Never Used  Substance and Sexual Activity  . Alcohol use: Yes    Alcohol/week: 0.0 standard drinks    Comment: Occasional  . Drug use: No  . Sexual activity: Yes    Birth control/protection: None  Lifestyle  . Physical activity:    Days per week: Not on file    Minutes per session: Not on file  . Stress: Not on file  Relationships  . Social connections:    Talks on phone: Not on file    Gets together: Not on file    Attends religious service: Not on file    Active member of club or organization: Not on file    Attends meetings of clubs or organizations: Not on file    Relationship status: Not on file  . Intimate partner violence:    Fear of current or ex partner: Not on file    Emotionally abused: Not on file    Physically abused: Not on file    Forced sexual activity: Not on file  Other Topics Concern  . Not on file  Social History Narrative  . Not on file    Family history:  Family History  Problem Relation Age of Onset  .  Hypothyroidism Mother   . COPD Maternal Grandmother        reactive airway disease     Physical Exam: BP (!) 100/55   Pulse (!) 112   Temp 97.9 F (37.3 C) (Oral)   Resp 19   Ht 5\' 6"  (1.676 m)   Wt 97.5 kg   LMP 09/29/2017   BMI 34.70 kg/m   Gen: NAD CV: Mild tachycardia Pulm: No increased work of breathing Pelvic: 2/60/-2, unchanged on exam 2.5 hours later Ext: No signs of DVT on exam  NST Baseline: 150 Variability: moderate Accelerations: present Decelerations: absent Tocometry: irregular contractions The patient was monitored for >30 minutes, fetal heart rate tracing was deemed reactive.  Significant Findings/ Diagnostic Studies: labs: UA negative for signs of UTI, urine culture pending  Procedures: NST  Discharge Condition:  good  Disposition: Discharge disposition: 01-Home or Self Care       Diet: Regular diet  Discharge Activity: Activity as tolerated  Discharge Instructions    Discharge activity:  No Restrictions   Complete by:  As directed    Discharge diet:  No restrictions   Complete by:  As directed    Fetal Kick Count:  Lie on our left side for one hour after a meal, and count the number of times your baby kicks.  If it is less than 5 times, get up, move around and drink some juice.  Repeat the test 30 minutes later.  If it is still less than 5 kicks in an hour, notify your doctor.   Complete by:  As directed    LABOR:  When conractions begin, you should start to time them from the beginning of one contraction to the beginning  of the next.  When contractions are 5 - 10 minutes apart or less and have been regular for at least an hour, you should call your health care provider.   Complete by:  As directed    No sexual activity restrictions   Complete by:  As directed    Notify physician for bleeding from the vagina   Complete by:  As directed    Notify physician for blurring of vision or spots before the eyes   Complete by:  As directed    Notify physician for chills or fever   Complete by:  As directed    Notify physician for fainting spells, "black outs" or loss of consciousness   Complete by:  As directed    Notify physician for increase in vaginal discharge   Complete by:  As directed    Notify physician for leaking of fluid   Complete by:  As directed    Notify physician for pain or burning when urinating   Complete by:  As directed    Notify physician for pelvic pressure (sudden increase)   Complete by:  As directed    Notify physician for severe or continued nausea or vomiting   Complete by:  As directed    Notify physician for sudden gushing of fluid from the vagina (with or without continued leaking)   Complete by:  As directed    Notify physician for sudden, constant, or  occasional abdominal pain   Complete by:  As directed    Notify physician if baby moving less than usual   Complete by:  As directed      Allergies as of 06/01/2018   No Known Allergies     Medication List    STOP taking these medications  benzocaine-Menthol 20-0.5 % Aero Commonly known as:  DERMOPLAST     TAKE these medications   acetaminophen 500 MG tablet Commonly known as:  TYLENOL Take 500 mg by mouth every 6 (six) hours as needed for mild pain or headache.   amphetamine-dextroamphetamine 10 MG tablet Commonly known as:  ADDERALL Take 10 mg by mouth 3 (three) times daily.   prenatal multivitamin Tabs tablet Take 1 tablet by mouth daily at 12 noon.      Follow-up Information    [facility] Follow up.   Contact information: [place] [id number] [phone]         Pain and contractions subsided with hydration and one dose of terbutaline. No cervical change during observation. Return precautions emphasized.  Signed: Rexene Agent, CNM  06/01/2018, 6:10 PM

## 2018-06-01 NOTE — Discharge Instructions (Signed)
Please keep follow up appointment.  If you have any questions you may call your on call provider. You may also call the nurses desk at [facility] for questions [phone].  If you have urgent concerns please go to the nearest emergency department for evaluation.

## 2018-06-01 NOTE — OB Triage Note (Signed)
Patient presented to L&D with complaints of back and abdominal since 1800 yesterday evening, denies decreased fetal movement, leaking of fluid or vaginal bleeding.

## 2018-06-03 LAB — URINE CULTURE

## 2018-06-04 ENCOUNTER — Encounter: Payer: Self-pay | Admitting: Maternal Newborn

## 2018-06-04 ENCOUNTER — Ambulatory Visit (INDEPENDENT_AMBULATORY_CARE_PROVIDER_SITE_OTHER): Payer: BC Managed Care – PPO | Admitting: Maternal Newborn

## 2018-06-04 ENCOUNTER — Other Ambulatory Visit: Payer: Self-pay

## 2018-06-04 VITALS — BP 130/80 | Wt 214.0 lb

## 2018-06-04 DIAGNOSIS — O09293 Supervision of pregnancy with other poor reproductive or obstetric history, third trimester: Secondary | ICD-10-CM

## 2018-06-04 DIAGNOSIS — L299 Pruritus, unspecified: Secondary | ICD-10-CM

## 2018-06-04 DIAGNOSIS — Z8759 Personal history of other complications of pregnancy, childbirth and the puerperium: Secondary | ICD-10-CM

## 2018-06-04 DIAGNOSIS — O0990 Supervision of high risk pregnancy, unspecified, unspecified trimester: Secondary | ICD-10-CM

## 2018-06-04 DIAGNOSIS — Z3A35 35 weeks gestation of pregnancy: Secondary | ICD-10-CM

## 2018-06-04 DIAGNOSIS — Z8719 Personal history of other diseases of the digestive system: Secondary | ICD-10-CM

## 2018-06-04 DIAGNOSIS — O99713 Diseases of the skin and subcutaneous tissue complicating pregnancy, third trimester: Secondary | ICD-10-CM

## 2018-06-04 LAB — POCT URINALYSIS DIPSTICK OB
Glucose, UA: NEGATIVE
POC,PROTEIN,UA: NEGATIVE

## 2018-06-04 MED ORDER — HYDROXYZINE HCL 25 MG PO TABS: 25.0000 mg | ORAL_TABLET | Freq: Four times a day (QID) | ORAL | 2 refills | Status: DC | PRN

## 2018-06-04 NOTE — Progress Notes (Signed)
ROB- intense itching

## 2018-06-04 NOTE — Progress Notes (Signed)
    Prenatal Problem Visit  Subjective  Annette Lawson is a 33 y.o. G2P1001 at [redacted]w[redacted]d being seen today for ongoing prenatal care.  She is currently monitored for the following issues for this high-risk pregnancy and has ADD (attention deficit disorder); Vitamin D deficiency; Right upper quadrant abdominal pain; Multinodular goiter; Supervision of high risk pregnancy, antepartum; Rh negative state in antepartum period; History of cholestasis during pregnancy; Depression affecting pregnancy; and History of postpartum hemorrhage, currently pregnant on their problem list.  ----------------------------------------------------------------------------------- Patient reports itching all over her body that began yesterday. She tried taking Benadryl with no relief. Especially itchy on her abdomen and palms of her hands. No rash, some marks on her skin where she has been scratching but no excoriation.  Contractions: Not present. Vag. Bleeding: None.  Movement: Present. No leaking of fluid.  ----------------------------------------------------------------------------------- The following portions of the patient's history were reviewed and updated as appropriate: allergies, current medications, past family history, past medical history, past social history, past surgical history and problem list. Problem list updated.   Objective  Blood pressure 130/80, weight 214 lb (97.1 kg), last menstrual period 09/29/2017.  Urinalysis: Urine dipstick shows negative for glucose, protein. Fetal Status: Fetal Heart Rate (bpm): 140   Movement: Present     General:  Alert, oriented and cooperative. Patient is in no acute distress.  Skin: Skin is warm and dry. No rash noted.   Cardiovascular: Normal heart rate noted  Respiratory: Normal respiratory effort, no problems with respiration noted  Abdomen: Soft, gravid, appropriate for gestational age. Pain/Pressure: Absent     Pelvic:  Cervical exam deferred         Extremities: Normal range of motion.     Mental Status: Normal mood and affect. Normal behavior. Normal judgment and thought content.     Assessment   33 y.o. G2P1001 at [redacted]w[redacted]d, EDD 07/06/2018 by Last Menstrual Period presenting for a work-in prenatal visit.  Plan   pregnancy Problems (from 11/20/17 to present)    Problem Noted Resolved   Rh negative state in antepartum period 11/20/2017 by Will Bonnet, MD No   History of cholestasis during pregnancy 11/20/2017 by Will Bonnet, MD No   Depression affecting pregnancy 11/20/2017 by Will Bonnet, MD No   History of postpartum hemorrhage, currently pregnant 11/20/2017 by Will Bonnet, MD No    Bile acids checked about two weeks ago, but will recheck with CMP in the setting of new intense itching with palms affected.  Rx for hydroxyzine to see if symptoms are improved.  Return in about 1 week (around 06/11/2018) for ROB.  [name], CNM 06/04/2018  2:38 PM

## 2018-06-05 ENCOUNTER — Encounter: Payer: BC Managed Care – PPO | Admitting: Maternal Newborn

## 2018-06-06 LAB — COMPREHENSIVE METABOLIC PANEL
ALT: 35 IU/L — ABNORMAL HIGH (ref 0–32)
AST: 56 IU/L — ABNORMAL HIGH (ref 0–40)
Albumin/Globulin Ratio: 1.3 (ref 1.2–2.2)
Albumin: 3.3 g/dL — ABNORMAL LOW (ref 3.8–4.8)
Alkaline Phosphatase: 197 IU/L — ABNORMAL HIGH (ref 39–117)
BUN/Creatinine Ratio: 9 (ref 9–23)
BUN: 5 mg/dL — ABNORMAL LOW (ref 6–20)
Bilirubin Total: 0.3 mg/dL (ref 0.0–1.2)
CO2: 20 mmol/L (ref 20–29)
Calcium: 8.9 mg/dL (ref 8.7–10.2)
Chloride: 103 mmol/L (ref 96–106)
Creatinine, Ser: 0.57 mg/dL (ref 0.57–1.00)
GFR calc Af Amer: 143 mL/min/{1.73_m2} (ref >59)
GFR calc non Af Amer: 124 mL/min/{1.73_m2} (ref >59)
Globulin, Total: 2.5 g/dL (ref 1.5–4.5)
Glucose: 73 mg/dL (ref 65–99)
Potassium: 3.8 mmol/L (ref 3.5–5.2)
Sodium: 138 mmol/L (ref 134–144)
Total Protein: 5.8 g/dL — ABNORMAL LOW (ref 6.0–8.5)

## 2018-06-06 LAB — BILE ACIDS, TOTAL: Bile Acids Total: 8.5 umol/L (ref 0.0–10.0)

## 2018-06-11 ENCOUNTER — Other Ambulatory Visit (HOSPITAL_COMMUNITY)
Admission: RE | Admit: 2018-06-11 | Discharge: 2018-06-11 | Disposition: A | Discharge status: Home / Self Care | Payer: BC Managed Care – PPO | Source: Ambulatory Visit | Attending: Obstetrics and Gynecology | Admitting: Obstetrics and Gynecology

## 2018-06-11 ENCOUNTER — Ambulatory Visit (INDEPENDENT_AMBULATORY_CARE_PROVIDER_SITE_OTHER): Payer: BC Managed Care – PPO | Admitting: Certified Nurse Midwife

## 2018-06-11 ENCOUNTER — Observation Stay
Admission: EM | Admit: 2018-06-11 | Discharge: 2018-06-12 | Disposition: A | Discharge status: Home / Self Care | Payer: BC Managed Care – PPO | Attending: Obstetrics & Gynecology | Admitting: Obstetrics & Gynecology

## 2018-06-11 ENCOUNTER — Encounter: Payer: BC Managed Care – PPO | Admitting: Obstetrics and Gynecology

## 2018-06-11 ENCOUNTER — Other Ambulatory Visit: Payer: Self-pay

## 2018-06-11 ENCOUNTER — Encounter: Payer: Self-pay | Admitting: Certified Nurse Midwife

## 2018-06-11 VITALS — BP 128/78 | Wt 215.0 lb

## 2018-06-11 DIAGNOSIS — O26893 Other specified pregnancy related conditions, third trimester: Principal | ICD-10-CM | POA: Insufficient documentation

## 2018-06-11 DIAGNOSIS — Z3685 Encounter for antenatal screening for Streptococcus B: Secondary | ICD-10-CM

## 2018-06-11 DIAGNOSIS — Z113 Encounter for screening for infections with a predominantly sexual mode of transmission: Secondary | ICD-10-CM | POA: Insufficient documentation

## 2018-06-11 DIAGNOSIS — Z3A36 36 weeks gestation of pregnancy: Secondary | ICD-10-CM | POA: Insufficient documentation

## 2018-06-11 DIAGNOSIS — O0990 Supervision of high risk pregnancy, unspecified, unspecified trimester: Secondary | ICD-10-CM | POA: Diagnosis present

## 2018-06-11 DIAGNOSIS — Z79899 Other long term (current) drug therapy: Secondary | ICD-10-CM | POA: Diagnosis not present

## 2018-06-11 DIAGNOSIS — O418X30 Other specified disorders of amniotic fluid and membranes, third trimester, not applicable or unspecified: Secondary | ICD-10-CM | POA: Diagnosis not present

## 2018-06-11 DIAGNOSIS — K831 Obstruction of bile duct: Secondary | ICD-10-CM | POA: Diagnosis not present

## 2018-06-11 DIAGNOSIS — O99713 Diseases of the skin and subcutaneous tissue complicating pregnancy, third trimester: Secondary | ICD-10-CM

## 2018-06-11 DIAGNOSIS — M549 Dorsalgia, unspecified: Secondary | ICD-10-CM | POA: Diagnosis present

## 2018-06-11 DIAGNOSIS — O99343 Other mental disorders complicating pregnancy, third trimester: Secondary | ICD-10-CM | POA: Insufficient documentation

## 2018-06-11 DIAGNOSIS — L299 Pruritus, unspecified: Secondary | ICD-10-CM

## 2018-06-11 DIAGNOSIS — Z1159 Encounter for screening for other viral diseases: Secondary | ICD-10-CM | POA: Insufficient documentation

## 2018-06-11 DIAGNOSIS — O99891 Other specified diseases and conditions complicating pregnancy: Secondary | ICD-10-CM

## 2018-06-11 DIAGNOSIS — F988 Other specified behavioral and emotional disorders with onset usually occurring in childhood and adolescence: Secondary | ICD-10-CM | POA: Diagnosis not present

## 2018-06-11 DIAGNOSIS — O26613 Liver and biliary tract disorders in pregnancy, third trimester: Secondary | ICD-10-CM | POA: Insufficient documentation

## 2018-06-11 DIAGNOSIS — M545 Low back pain: Secondary | ICD-10-CM | POA: Diagnosis not present

## 2018-06-11 LAB — FETAL NONSTRESS TEST

## 2018-06-11 LAB — POCT URINALYSIS DIPSTICK OB: Glucose, UA: NEGATIVE

## 2018-06-11 LAB — URINALYSIS, ROUTINE W REFLEX MICROSCOPIC
Bilirubin Urine: NEGATIVE
Glucose, UA: NEGATIVE mg/dL
Hgb urine dipstick: NEGATIVE
Ketones, ur: NEGATIVE mg/dL
Leukocytes,Ua: NEGATIVE
Nitrite: NEGATIVE
Protein, ur: NEGATIVE mg/dL
Specific Gravity, Urine: 1.005 (ref 1.005–1.030)
pH: 6 (ref 5.0–8.0)

## 2018-06-11 LAB — RUPTURE OF MEMBRANE (ROM)PLUS: Rom Plus: POSITIVE

## 2018-06-11 MED ORDER — LIDOCAINE HCL (PF) 1 % IJ SOLN: 30.0000 mL | INTRAMUSCULAR | Status: DC | PRN

## 2018-06-11 MED ORDER — ACETAMINOPHEN 325 MG PO TABS: 650.0000 mg | ORAL_TABLET | ORAL | Status: DC | PRN

## 2018-06-11 MED ORDER — ONDANSETRON HCL 4 MG/2ML IJ SOLN: 4.0000 mg | Freq: Four times a day (QID) | INTRAMUSCULAR | Status: DC | PRN

## 2018-06-11 NOTE — Progress Notes (Signed)
ROB

## 2018-06-11 NOTE — Progress Notes (Signed)
ROB at [place] days: Hand, foot, arm pruritis not as intense the last two days. Last needed Atarax 3 days ago to help her sleep. No rash. Baby active. Hx of cholestasis with last pregnancy. LAbs from 5/26: bile acids 8.5 (up from 1.9 3 weeks ago) AST 56 (up from 16 three weeks ago) ALT 35 (up from 9 three weeks ago) NST today: reactive with baseline 135-140 and accelerations to 150s to 160s, moderate variability  A: IUP at 36wk3days Suspect cholestasis  P:Discussed POM with Dr Glennon Mac Bile acids and CMP today RTO 5 June for NST and to follow up on labs If bile acids are elevated, anticipate IOL at 37 weeks [place] instructions Labor precautions GBS/ Aptima today Desires BTL if needs a Cesarean section, otherwise husband planning on Vasectomy Breast Dalia Heading, CNM

## 2018-06-12 DIAGNOSIS — M549 Dorsalgia, unspecified: Secondary | ICD-10-CM

## 2018-06-12 DIAGNOSIS — O26893 Other specified pregnancy related conditions, third trimester: Secondary | ICD-10-CM

## 2018-06-12 DIAGNOSIS — Z3A36 36 weeks gestation of pregnancy: Secondary | ICD-10-CM

## 2018-06-12 DIAGNOSIS — N898 Other specified noninflammatory disorders of vagina: Secondary | ICD-10-CM | POA: Diagnosis not present

## 2018-06-12 LAB — COMPREHENSIVE METABOLIC PANEL
ALT: 40 IU/L — ABNORMAL HIGH (ref 0–32)
AST: 36 IU/L (ref 0–40)
Albumin/Globulin Ratio: 1.4 (ref 1.2–2.2)
Albumin: 3.5 g/dL — ABNORMAL LOW (ref 3.8–4.8)
Alkaline Phosphatase: 175 IU/L — ABNORMAL HIGH (ref 39–117)
BUN/Creatinine Ratio: 11 (ref 9–23)
BUN: 6 mg/dL (ref 6–20)
Bilirubin Total: <0.2 mg/dL (ref 0.0–1.2)
CO2: 18 mmol/L — ABNORMAL LOW (ref 20–29)
Calcium: 9 mg/dL (ref 8.7–10.2)
Chloride: 102 mmol/L (ref 96–106)
Creatinine, Ser: 0.53 mg/dL — ABNORMAL LOW (ref 0.57–1.00)
GFR calc Af Amer: 146 mL/min/{1.73_m2} (ref >59)
GFR calc non Af Amer: 127 mL/min/{1.73_m2} (ref >59)
Globulin, Total: 2.5 g/dL (ref 1.5–4.5)
Glucose: 112 mg/dL — ABNORMAL HIGH (ref 65–99)
Potassium: 4.3 mmol/L (ref 3.5–5.2)
Sodium: 138 mmol/L (ref 134–144)
Total Protein: 6 g/dL (ref 6.0–8.5)

## 2018-06-12 LAB — BILE ACIDS, TOTAL: Bile Acids Total: 4.2 umol/L (ref 0.0–10.0)

## 2018-06-12 LAB — SARS CORONAVIRUS 2 BY RT PCR (HOSPITAL ORDER, PERFORMED IN ~~LOC~~ HOSPITAL LAB): SARS Coronavirus 2: NEGATIVE

## 2018-06-12 NOTE — Final Progress Note (Signed)
Physician Final Progress Note  Patient ID: Annette Lawson MRN: [id number] DOB/AGE: 1985-12-31 33 y.o.  Admit date: 06/11/2018 Admitting provider: Gae Dry, MD Discharge date: 06/12/2018  Admission Diagnoses: Leakage, Back pain, 36 weeks pregnancy  Discharge Diagnoses:  Active Problems:   Back pain affecting pregnancy   No s/sx PROM, PTL, UTI  Consults: None  Significant Findings/ Diagnostic Studies:  Obstetrics Admission History & Physical   Contractions   HPI:  33 y.o. G2P1001 @ [redacted]w[redacted]d (07/06/2018, by Last Menstrual Period). Admitted on 06/11/2018:   Patient Active Problem List   Diagnosis Date Noted  . Back pain affecting pregnancy 06/11/2018  . Supervision of high risk pregnancy, antepartum 11/20/2017  . Rh negative state in antepartum period 11/20/2017  . History of cholestasis during pregnancy 11/20/2017  . Depression affecting pregnancy 11/20/2017  . History of postpartum hemorrhage, currently pregnant 11/20/2017  . Right upper quadrant abdominal pain 07/02/2017  . Multinodular goiter 07/02/2017  . Vitamin D deficiency 02/07/2017  . ADD (attention deficit disorder) 02/22/2015     Presents for low back pain and she also reports sopme irreg ctxs.  Pain in back in dull, nonradiating, no associated sx's or mopdifiers.  Was seen in office today.  Also reports vag d/c earlier, not persistent..   Prenatal care at: at [facility]. Pregnancy complicated by cholestasis.  ROS: A review of systems was performed and negative, except as stated in the above HPI. PMHx:  Past Medical History:  Diagnosis Date  . ADD (attention deficit disorder)   . Cholestasis during pregnancy   . Depression   . Vaginal Pap smear, abnormal    PSHx:  Past Surgical History:  Procedure Laterality Date  . WISDOM TOOTH EXTRACTION     Medications:  Medications Prior to Admission  Medication Sig Dispense Refill Last Dose  . acetaminophen (TYLENOL) 500 MG tablet Take 500 mg by mouth every 6  (six) hours as needed for mild pain or headache.   Past Week at Unknown time  . amphetamine-dextroamphetamine (ADDERALL) 10 MG tablet Take 10 mg by mouth 3 (three) times daily.   06/11/2018 at Unknown time  . hydrOXYzine (ATARAX/VISTARIL) 25 MG tablet Take 1 tablet (25 mg total) by mouth every 6 (six) hours as needed for itching. 30 tablet 2 06/11/2018 at Unknown time  . Prenatal Vit-Fe Fumarate-FA (PRENATAL MULTIVITAMIN) TABS tablet Take 1 tablet by mouth daily at 12 noon.   06/11/2018 at Unknown time  . diphenhydrAMINE (BENADRYL) 25 MG tablet Take 25 mg by mouth every 6 (six) hours as needed.   Not Taking at Unknown time   Allergies: has No Known Allergies. OBHx:  OB History  Gravida Para Term Preterm AB Living  2 1 1     1   SAB TAB Ectopic Multiple Live Births        0 1    # Outcome Date GA Lbr Len/2nd Weight Sex Delivery Anes PTL Lv  2 Current           1 Term 12/01/15 [redacted]w[redacted]d 07:40 / 02:01 3090 g F Vag-Spont EPI  LIV   WUJ:WJXBJYNW/GNFAOZHYQMVH except as detailed in HPI.Marland Kitchen  No family history of birth defects. Soc Hx: Alcohol: none and Recreational drug use: none  Objective:   Vitals:   06/11/18 2137 06/12/18 0112  BP: 121/70 126/63  Pulse: 85 79  Resp:  18  Temp:  98 F (36.7 C)   Constitutional: Well nourished, well developed female in no acute distress.  HEENT: normal Skin: Warm and dry.  Cardiovascular:Regular rate and rhythm.   Extremity: trace to 1+ bilateral pedal edema Respiratory: Clear to auscultation bilateral. Normal respiratory effort Abdomen: gravid, ND, FHT present, mild tenderness on exam Back: no CVAT Neuro: DTRs 2+, Cranial nerves grossly intact Psych: Alert and Oriented x3. No memory deficits. Normal mood and affect.  MS: normal gait, normal bilateral lower extremity ROM/strength/stability.  Pelvic exam: is not limited by body habitus EGBUS: within normal limits Vagina: within normal limits and with normal mucosa Cervix: EXTERNAL GENITALIA: normal  appearing vulva with no masses, tenderness or lesions CERVIX: 1 cm dilated, 60 effaced, -3 station MEMBRANES: sterile speculum exam - no pooling Uterus: No contractions observed for 120 minutes.  Adnexa: not evaluated  EFM:FHR: 140 bpm, variability: moderate,  accelerations:  Present,  decelerations:  Absent Toco: None  Results for orders placed or performed during the hospital encounter of 06/11/18  SARS Coronavirus 2 (CEPHEID - Performed in [facility] hospital lab), [facility] Order  Result Value Ref Range   SARS Coronavirus 2 NEGATIVE NEGATIVE  Urinalysis, Routine w reflex microscopic  Result Value Ref Range   Color, Urine STRAW (A) YELLOW   APPearance CLEAR (A) CLEAR   Specific Gravity, Urine 1.005 1.005 - 1.030   pH 6.0 5.0 - 8.0   Glucose, UA NEGATIVE NEGATIVE mg/dL   Hgb urine dipstick NEGATIVE NEGATIVE   Bilirubin Urine NEGATIVE NEGATIVE   Ketones, ur NEGATIVE NEGATIVE mg/dL   Protein, ur NEGATIVE NEGATIVE mg/dL   Nitrite NEGATIVE NEGATIVE   Leukocytes,Ua NEGATIVE NEGATIVE  ROM Plus ([facility] only)  Result Value Ref Range   Rom Plus POSITIVE      Assessment & Plan:   33 y.o. G2P1001 @ [redacted]w[redacted]d, Admitted on 06/11/2018:Low back pain No s/sxc PROM, PTL, UTI      Procedures: A NST procedure was performed with FHR monitoring and a normal baseline established, appropriate time of 20-40 minutes of evaluation, and accels >2 seen w 15x15 characteristics.  Results show a REACTIVE NST.   Microscopy: No fern.  No pooling.    ROM+ was pos.  Discharge Condition: good  Disposition: Discharge disposition: 01-Home or Self Care       Diet: Regular diet  Discharge Activity: Activity as tolerated  Discharge Instructions    Call MD for:   Complete by:  As directed    Worsening contractions or pain; leakage of fluid; bleeding.   Diet general   Complete by:  As directed    Increase activity slowly   Complete by:  As directed      Allergies as of 06/12/2018   No Known Allergies      Medication List    STOP taking these medications   amphetamine-dextroamphetamine 10 MG tablet Commonly known as:  ADDERALL     TAKE these medications   acetaminophen 500 MG tablet Commonly known as:  TYLENOL Take 500 mg by mouth every 6 (six) hours as needed for mild pain or headache.   diphenhydrAMINE 25 MG tablet Commonly known as:  BENADRYL Take 25 mg by mouth every 6 (six) hours as needed.   hydrOXYzine 25 MG tablet Commonly known as:  ATARAX/VISTARIL Take 1 tablet (25 mg total) by mouth every 6 (six) hours as needed for itching.   prenatal multivitamin Tabs tablet Take 1 tablet by mouth daily at 12 noon.        Total time spent taking care of this patient: 60 minutes  Signed: Hoyt Koch 06/12/2018, 1:15 AM

## 2018-06-12 NOTE — Discharge Summary (Signed)
See FPN

## 2018-06-12 NOTE — OB Triage Note (Signed)
Patient came in for observation for lower back pain that started at 1830. Patient reports irregular contractions. Patient denies leaking of fluid and denies vaginal bleeding and spotting. Patient reports + FM. Vital signs stable and patient afebrile. FHR baseline 130 with moderate variability with accelerations 15 x 15 and no decelerations. Patient in room alone. Will continue to monitor.

## 2018-06-12 NOTE — Discharge Instructions (Signed)
LABOR: When contractions begin, you should start to time them from the beginning of one contraction to the beginning of the next.  When contractions are 5-10 minutes apart or less and have been regular for at least an hour, you should call your health care provider.  Notify your doctor if any of the following occur: 1. Bleeding from the vagina 7. Sudden, constant, or occasional abdominal pain  2. Pain or burning when urinating 8. Sudden gushing of fluid from the vagina (with or without continued leaking)  3. Chills or fever 9. Fainting spells, "black outs" or loss of consciousness  4. Increase in vaginal discharge 10. Severe or continued nausea or vomiting  5. Pelvic pressure (sudden increase) 11. Blurring of vision or spots before the eyes  6. Baby moving less than usual 12. Leaking of fluid    FETAL KICK COUNT: Lie on your left side for one hour after a meal, and count the number of times your baby kicks. If it is less than 5 times, get up, move around and drink some juice. Repeat the test 30 minutes later. If it is still less than 5 kicks in an hour, notify your doctor.

## 2018-06-14 ENCOUNTER — Other Ambulatory Visit: Payer: Self-pay

## 2018-06-14 ENCOUNTER — Encounter: Payer: Self-pay | Admitting: Maternal Newborn

## 2018-06-14 ENCOUNTER — Ambulatory Visit (INDEPENDENT_AMBULATORY_CARE_PROVIDER_SITE_OTHER): Payer: BC Managed Care – PPO | Admitting: Maternal Newborn

## 2018-06-14 VITALS — BP 120/60 | Wt 212.6 lb

## 2018-06-14 DIAGNOSIS — Z6791 Unspecified blood type, Rh negative: Secondary | ICD-10-CM

## 2018-06-14 DIAGNOSIS — O26893 Other specified pregnancy related conditions, third trimester: Secondary | ICD-10-CM

## 2018-06-14 DIAGNOSIS — O0990 Supervision of high risk pregnancy, unspecified, unspecified trimester: Secondary | ICD-10-CM

## 2018-06-14 DIAGNOSIS — Z3A36 36 weeks gestation of pregnancy: Secondary | ICD-10-CM

## 2018-06-14 LAB — POCT URINALYSIS DIPSTICK OB
Glucose, UA: NEGATIVE
POC,PROTEIN,UA: NEGATIVE

## 2018-06-14 LAB — FETAL NONSTRESS TEST

## 2018-06-14 LAB — CERVICOVAGINAL ANCILLARY ONLY
Chlamydia: NEGATIVE
Neisseria Gonorrhea: NEGATIVE
Trichomonas: NEGATIVE

## 2018-06-14 NOTE — Patient Instructions (Signed)
Third Trimester of Pregnancy The third trimester is from week 28 through week 40 (months 7 through 9). The third trimester is a time when the unborn baby (fetus) is growing rapidly. At the end of the ninth month, the fetus is about 20 inches in length and weighs 6-10 pounds. Body changes during your third trimester Your body will continue to go through many changes during pregnancy. The changes vary from woman to woman. During the third trimester:  Your weight will continue to increase. You can expect to gain 25-35 pounds (11-16 kg) by the end of the pregnancy.  You may begin to get stretch marks on your hips, abdomen, and breasts.  You may urinate more often because the fetus is moving lower into your pelvis and pressing on your bladder.  You may develop or continue to have heartburn. This is caused by increased hormones that slow down muscles in the digestive tract.  You may develop or continue to have constipation because increased hormones slow digestion and cause the muscles that push waste through your intestines to relax.  You may develop hemorrhoids. These are swollen veins (varicose veins) in the rectum that can itch or be painful.  You may develop swollen, bulging veins (varicose veins) in your legs.  You may have increased body aches in the pelvis, back, or thighs. This is due to weight gain and increased hormones that are relaxing your joints.  You may have changes in your hair. These can include thickening of your hair, rapid growth, and changes in texture. Some women also have hair loss during or after pregnancy, or hair that feels dry or thin. Your hair will most likely return to normal after your baby is born.  Your breasts will continue to grow and they will continue to become tender. A yellow fluid (colostrum) may leak from your breasts. This is the first milk you are producing for your baby.  Your belly button may stick out.  You may notice more swelling in your hands,  face, or ankles.  You may have increased tingling or numbness in your hands, arms, and legs. The skin on your belly may also feel numb.  You may feel short of breath because of your expanding uterus.  You may have more problems sleeping. This can be caused by the size of your belly, increased need to urinate, and an increase in your body's metabolism.  You may notice the fetus "dropping," or moving lower in your abdomen (lightening).  You may have increased vaginal discharge.  You may notice your joints feel loose and you may have pain around your pelvic bone. What to expect at prenatal visits You will have prenatal exams every 2 weeks until week 36. Then you will have weekly prenatal exams. During a routine prenatal visit:  You will be weighed to make sure you and the baby are growing normally.  Your blood pressure will be taken.  Your abdomen will be measured to track your baby's growth.  The fetal heartbeat will be listened to.  Any test results from the previous visit will be discussed.  You may have a cervical check near your due date to see if your cervix has softened or thinned (effaced).  You will be tested for Group B streptococcus. This happens between 35 and 37 weeks. Your health care provider may ask you:  What your birth plan is.  How you are feeling.  If you are feeling the baby move.  If you have had any abnormal   symptoms, such as leaking fluid, bleeding, severe headaches, or abdominal cramping.  If you are using any tobacco products, including cigarettes, chewing tobacco, and electronic cigarettes.  If you have any questions. Other tests or screenings that may be performed during your third trimester include:  Blood tests that check for low iron levels (anemia).  Fetal testing to check the health, activity level, and growth of the fetus. Testing is done if you have certain medical conditions or if there are problems during the pregnancy.  Nonstress test  (NST). This test checks the health of your baby to make sure there are no signs of problems, such as the baby not getting enough oxygen. During this test, a belt is placed around your belly. The baby is made to move, and its heart rate is monitored during movement. What is false labor? False labor is a condition in which you feel small, irregular tightenings of the muscles in the womb (contractions) that usually go away with rest, changing position, or drinking water. These are called Braxton Hicks contractions. Contractions may last for hours, days, or even weeks before true labor sets in. If contractions come at regular intervals, become more frequent, increase in intensity, or become painful, you should see your health care provider. What are the signs of labor?  Abdominal cramps.  Regular contractions that start at 10 minutes apart and become stronger and more frequent with time.  Contractions that start on the top of the uterus and spread down to the lower abdomen and back.  Increased pelvic pressure and dull back pain.  A watery or bloody mucus discharge that comes from the vagina.  Leaking of amniotic fluid. This is also known as your "water breaking." It could be a slow trickle or a gush. Let your health care provider know if it has a color or strange odor. If you have any of these signs, call your health care provider right away, even if it is before your due date. Follow these instructions at home: Medicines  Follow your health care provider's instructions regarding medicine use. Specific medicines may be either safe or unsafe to take during pregnancy.  Take a prenatal vitamin that contains at least 600 micrograms (mcg) of folic acid.  If you develop constipation, try taking a stool softener if your health care provider approves. Eating and drinking   Eat a balanced diet that includes fresh fruits and vegetables, whole grains, good sources of protein such as meat, eggs, or tofu,  and low-fat dairy. Your health care provider will help you determine the amount of weight gain that is right for you.  Avoid raw meat and uncooked cheese. These carry germs that can cause birth defects in the baby.  If you have low calcium intake from food, talk to your health care provider about whether you should take a daily calcium supplement.  Eat four or five small meals rather than three large meals a day.  Limit foods that are high in fat and processed sugars, such as fried and sweet foods.  To prevent constipation: ? Drink enough fluid to keep your urine clear or pale yellow. ? Eat foods that are high in fiber, such as fresh fruits and vegetables, whole grains, and beans. Activity  Exercise only as directed by your health care provider. Most women can continue their usual exercise routine during pregnancy. Try to exercise for 30 minutes at least 5 days a week. Stop exercising if you experience uterine contractions.  Avoid heavy lifting.  Do   not exercise in extreme heat or humidity, or at high altitudes.  Wear low-heel, comfortable shoes.  Practice good posture.  You may continue to have sex unless your health care provider tells you otherwise. Relieving pain and discomfort  Take frequent breaks and rest with your legs elevated if you have leg cramps or low back pain.  Take warm sitz baths to soothe any pain or discomfort caused by hemorrhoids. Use hemorrhoid cream if your health care provider approves.  Wear a good support bra to prevent discomfort from breast tenderness.  If you develop varicose veins: ? Wear support pantyhose or compression stockings as told by your healthcare provider. ? Elevate your feet for 15 minutes, 3-4 times a day. Prenatal care  Write down your questions. Take them to your prenatal visits.  Keep all your prenatal visits as told by your health care provider. This is important. Safety  Wear your seat belt at all times when driving.  Make  a list of emergency phone numbers, including numbers for family, friends, the hospital, and police and fire departments. General instructions  Avoid cat litter boxes and soil used by cats. These carry germs that can cause birth defects in the baby. If you have a cat, ask someone to clean the litter box for you.  Do not travel far distances unless it is absolutely necessary and only with the approval of your health care provider.  Do not use hot tubs, steam rooms, or saunas.  Do not drink alcohol.  Do not use any products that contain nicotine or tobacco, such as cigarettes and e-cigarettes. If you need help quitting, ask your health care provider.  Do not use any medicinal herbs or unprescribed drugs. These chemicals affect the formation and growth of the baby.  Do not douche or use tampons or scented sanitary pads.  Do not cross your legs for long periods of time.  To prepare for the arrival of your baby: ? Take prenatal classes to understand, practice, and ask questions about labor and delivery. ? Make a trial run to the hospital. ? Visit the hospital and tour the maternity area. ? Arrange for maternity or paternity leave through employers. ? Arrange for family and friends to take care of pets while you are in the hospital. ? Purchase a rear-facing car seat and make sure you know how to install it in your car. ? Pack your hospital bag. ? Prepare the baby's nursery. Make sure to remove all pillows and stuffed animals from the baby's crib to prevent suffocation.  Visit your dentist if you have not gone during your pregnancy. Use a soft toothbrush to brush your teeth and be gentle when you floss. Contact a health care provider if:  You are unsure if you are in labor or if your water has broken.  You become dizzy.  You have mild pelvic cramps, pelvic pressure, or nagging pain in your abdominal area.  You have lower back pain.  You have persistent nausea, vomiting, or  diarrhea.  You have an unusual or bad smelling vaginal discharge.  You have pain when you urinate. Get help right away if:  Your water breaks before 37 weeks.  You have regular contractions less than 5 minutes apart before 37 weeks.  You have a fever.  You are leaking fluid from your vagina.  You have spotting or bleeding from your vagina.  You have severe abdominal pain or cramping.  You have rapid weight loss or weight gain.  You have   shortness of breath with chest pain.  You notice sudden or extreme swelling of your face, hands, ankles, feet, or legs.  Your baby makes fewer than 10 movements in 2 hours.  You have severe headaches that do not go away when you take medicine.  You have vision changes. Summary  The third trimester is from week 28 through week 40, months 7 through 9. The third trimester is a time when the unborn baby (fetus) is growing rapidly.  During the third trimester, your discomfort may increase as you and your baby continue to gain weight. You may have abdominal, leg, and back pain, sleeping problems, and an increased need to urinate.  During the third trimester your breasts will keep growing and they will continue to become tender. A yellow fluid (colostrum) may leak from your breasts. This is the first milk you are producing for your baby.  False labor is a condition in which you feel small, irregular tightenings of the muscles in the womb (contractions) that eventually go away. These are called Braxton Hicks contractions. Contractions may last for hours, days, or even weeks before true labor sets in.  Signs of labor can include: abdominal cramps; regular contractions that start at 10 minutes apart and become stronger and more frequent with time; watery or bloody mucus discharge that comes from the vagina; increased pelvic pressure and dull back pain; and leaking of amniotic fluid. This information is not intended to replace advice given to you by your  health care provider. Make sure you discuss any questions you have with your health care provider. Document Released: 12/20/2000 Document Revised: 02/01/2016 Document Reviewed: 02/01/2016 Elsevier Interactive Patient Education  2019 [employer]  

## 2018-06-14 NOTE — Progress Notes (Signed)
    Routine Prenatal Care Visit  Subjective  Annette Lawson is a 33 y.o. G2P1001 at [redacted]w[redacted]d being seen today for ongoing prenatal care.  She is currently monitored for the following issues for this high-risk pregnancy and has ADD (attention deficit disorder); Vitamin D deficiency; Right upper quadrant abdominal pain; Multinodular goiter; Supervision of high risk pregnancy, antepartum; Rh negative state in antepartum period; History of cholestasis during pregnancy; Depression affecting pregnancy; History of postpartum hemorrhage, currently pregnant; and Back pain affecting pregnancy on their problem list.  ----------------------------------------------------------------------------------- Patient reports that itching has improved and become more intermittent. Has not needed to use hydroxyzine to sleep for a few days. Contractions: Not present. Vag. Bleeding: None.  Movement: Present. No leaking of fluid.  ----------------------------------------------------------------------------------- The following portions of the patient's history were reviewed and updated as appropriate: allergies, current medications, past family history, past medical history, past social history, past surgical history and problem list. Problem list updated.  Objective  Blood pressure 120/60, weight 212 lb 9.6 oz (96.4 kg), last menstrual period 09/29/2017. Pregravid weight 190 lb (86.2 kg) Total Weight Gain 22 lb 9.6 oz (10.3 kg) Urinalysis: Urine dipstick shows negative for glucose, protein.  Fetal Status: Fetal Heart Rate (bpm): 140   Movement: Present     General:  Alert, oriented and cooperative. Patient is in no acute distress.  Skin: Skin is warm and dry. No rash noted.   Cardiovascular: Normal heart rate noted  Respiratory: Normal respiratory effort, no problems with respiration noted  Abdomen: Soft, gravid, appropriate for gestational age. Pain/Pressure: Absent     Pelvic:  Cervical exam deferred         Extremities: Normal range of motion.     Mental Status: Normal mood and affect. Normal behavior. Normal judgment and thought content.   NST Baseline: 140 Variability: moderate Accelerations: present Decelerations: absent Tocometry: not done The patient was monitored for 20 minutes, fetal heart rate tracing was deemed reactive.  Assessment   33 y.o. G2P1001 at [redacted]w[redacted]d, EDD 07/06/2018 by Last Menstrual Period presenting for a routine prenatal visit.  Plan   pregnancy Problems (from 11/20/17 to present)    Problem Noted Resolved   Rh negative state in antepartum period 11/20/2017 by Will Bonnet, MD No   History of cholestasis during pregnancy 11/20/2017 by Will Bonnet, MD No   Depression affecting pregnancy 11/20/2017 by Will Bonnet, MD No   History of postpartum hemorrhage, currently pregnant 11/20/2017 by Will Bonnet, MD No      NST reactive today. Baby is moving well. Will recheck liver enzymes and bile acid labs with next visit.  Please refer to After Visit Summary for other counseling recommendations.   Return in about 4 days (around 06/18/2018) for ROB with NST and labs.  [name], CNM 06/14/2018  9:52 AM

## 2018-06-14 NOTE — Progress Notes (Signed)
ROB/NST- no concerns 

## 2018-06-15 LAB — CULTURE, BETA STREP (GROUP B ONLY): Strep Gp B Culture: POSITIVE — AB

## 2018-06-17 ENCOUNTER — Encounter: Payer: Self-pay | Admitting: Certified Nurse Midwife

## 2018-06-17 DIAGNOSIS — O99820 Streptococcus B carrier state complicating pregnancy: Secondary | ICD-10-CM | POA: Insufficient documentation

## 2018-06-19 ENCOUNTER — Ambulatory Visit (INDEPENDENT_AMBULATORY_CARE_PROVIDER_SITE_OTHER): Payer: BC Managed Care – PPO | Admitting: Maternal Newborn

## 2018-06-19 ENCOUNTER — Other Ambulatory Visit: Payer: Self-pay

## 2018-06-19 ENCOUNTER — Encounter: Payer: Self-pay | Admitting: Maternal Newborn

## 2018-06-19 VITALS — BP 118/78 | Wt 213.0 lb

## 2018-06-19 DIAGNOSIS — O0990 Supervision of high risk pregnancy, unspecified, unspecified trimester: Secondary | ICD-10-CM

## 2018-06-19 DIAGNOSIS — O09293 Supervision of pregnancy with other poor reproductive or obstetric history, third trimester: Secondary | ICD-10-CM | POA: Diagnosis not present

## 2018-06-19 DIAGNOSIS — O99713 Diseases of the skin and subcutaneous tissue complicating pregnancy, third trimester: Secondary | ICD-10-CM

## 2018-06-19 DIAGNOSIS — Z3A37 37 weeks gestation of pregnancy: Secondary | ICD-10-CM

## 2018-06-19 DIAGNOSIS — O0993 Supervision of high risk pregnancy, unspecified, third trimester: Secondary | ICD-10-CM

## 2018-06-19 DIAGNOSIS — Z8719 Personal history of other diseases of the digestive system: Secondary | ICD-10-CM

## 2018-06-19 DIAGNOSIS — Z8759 Personal history of other complications of pregnancy, childbirth and the puerperium: Secondary | ICD-10-CM

## 2018-06-19 DIAGNOSIS — L299 Pruritus, unspecified: Secondary | ICD-10-CM

## 2018-06-19 LAB — FETAL NONSTRESS TEST

## 2018-06-19 NOTE — Patient Instructions (Signed)

## 2018-06-19 NOTE — Progress Notes (Signed)
    Routine Prenatal Care Visit  Subjective  Annette Lawson is a 33 y.o. G2P1001 at [redacted]w[redacted]d being seen today for ongoing prenatal care.  She is currently monitored for the following issues for this high-risk pregnancy and has ADD (attention deficit disorder); Vitamin D deficiency; Right upper quadrant abdominal pain; Multinodular goiter; Supervision of high risk pregnancy, antepartum; Rh negative state in antepartum period; History of cholestasis during pregnancy; Depression affecting pregnancy; History of postpartum hemorrhage, currently pregnant; Back pain affecting pregnancy; and GBS (group B Streptococcus carrier), +RV culture, currently pregnant on their problem list.  ----------------------------------------------------------------------------------- Patient reports itching has improved; only one day of itching this weekend where she needed to take hydroxyzine to sleep. Contractions: Not present. Vag. Bleeding: None.  Movement: Present. No leaking of fluid.  ----------------------------------------------------------------------------------- The following portions of the patient's history were reviewed and updated as appropriate: allergies, current medications, past family history, past medical history, past social history, past surgical history and problem list. Problem list updated.  Objective  Blood pressure 118/78, weight 213 lb (96.6 kg), last menstrual period 09/29/2017. Pregravid weight 190 lb (86.2 kg) Total Weight Gain 23 lb (10.4 kg)  Fetal Status: Fetal Heart Rate (bpm): 150   Movement: Present     General:  Alert, oriented and cooperative. Patient is in no acute distress.  Skin: Skin is warm and dry. No rash noted.   Cardiovascular: Normal heart rate noted  Respiratory: Normal respiratory effort, no problems with respiration noted  Abdomen: Soft, gravid, appropriate for gestational age. Pain/Pressure: Present     Pelvic:  Cervical exam deferred        Extremities: Normal range  of motion.     Mental Status: Normal mood and affect. Normal behavior. Normal judgment and thought content.   NST Baseline: 150 Variability: moderate Accelerations: present Decelerations: absent Tocometry: not done The patient was monitored for 20 minutes, fetal heart rate tracing was deemed reactive.  Assessment   33 y.o. G2P1001 at [redacted]w[redacted]d, EDD 07/06/2018 by Last Menstrual Period presenting for a routine prenatal visit.  Plan   pregnancy Problems (from 11/20/17 to present)    Problem Noted Resolved   Rh negative state in antepartum period 11/20/2017 by Will Bonnet, MD No   History of cholestasis during pregnancy 11/20/2017 by Will Bonnet, MD No   Depression affecting pregnancy 11/20/2017 by Will Bonnet, MD No   History of postpartum hemorrhage, currently pregnant 11/20/2017 by Will Bonnet, MD No    Reactive NST, repeat labs today.  Please refer to After Visit Summary for other counseling recommendations.   Return in about 5 days (around 06/24/2018) for ROB with NST.  [name], CNM 06/19/2018  3:25 PM

## 2018-06-19 NOTE — Progress Notes (Signed)
No vb. No lof. NST today. Some braxton hicks, but nothing consistent.

## 2018-06-21 LAB — BILE ACIDS, TOTAL: Bile Acids Total: 3.6 umol/L (ref 0.0–10.0)

## 2018-06-21 LAB — COMPREHENSIVE METABOLIC PANEL
ALT: 19 IU/L (ref 0–32)
AST: 17 IU/L (ref 0–40)
Albumin/Globulin Ratio: 1.5 (ref 1.2–2.2)
Albumin: 3.7 g/dL — ABNORMAL LOW (ref 3.8–4.8)
Alkaline Phosphatase: 167 IU/L — ABNORMAL HIGH (ref 39–117)
BUN/Creatinine Ratio: 15 (ref 9–23)
BUN: 7 mg/dL (ref 6–20)
Bilirubin Total: <0.2 mg/dL (ref 0.0–1.2)
CO2: 17 mmol/L — ABNORMAL LOW (ref 20–29)
Calcium: 9.5 mg/dL (ref 8.7–10.2)
Chloride: 103 mmol/L (ref 96–106)
Creatinine, Ser: 0.48 mg/dL — ABNORMAL LOW (ref 0.57–1.00)
GFR calc Af Amer: 151 mL/min/{1.73_m2} (ref >59)
GFR calc non Af Amer: 131 mL/min/{1.73_m2} (ref >59)
Globulin, Total: 2.5 g/dL (ref 1.5–4.5)
Glucose: 76 mg/dL (ref 65–99)
Potassium: 4.4 mmol/L (ref 3.5–5.2)
Sodium: 137 mmol/L (ref 134–144)
Total Protein: 6.2 g/dL (ref 6.0–8.5)

## 2018-06-24 ENCOUNTER — Ambulatory Visit (INDEPENDENT_AMBULATORY_CARE_PROVIDER_SITE_OTHER): Payer: BC Managed Care – PPO | Admitting: Maternal Newborn

## 2018-06-24 ENCOUNTER — Other Ambulatory Visit: Payer: Self-pay

## 2018-06-24 ENCOUNTER — Encounter: Payer: Self-pay | Admitting: Maternal Newborn

## 2018-06-24 VITALS — BP 122/64 | Wt 218.0 lb

## 2018-06-24 DIAGNOSIS — O26893 Other specified pregnancy related conditions, third trimester: Secondary | ICD-10-CM

## 2018-06-24 DIAGNOSIS — O0990 Supervision of high risk pregnancy, unspecified, unspecified trimester: Secondary | ICD-10-CM

## 2018-06-24 DIAGNOSIS — Z6791 Unspecified blood type, Rh negative: Secondary | ICD-10-CM

## 2018-06-24 DIAGNOSIS — O99820 Streptococcus B carrier state complicating pregnancy: Secondary | ICD-10-CM

## 2018-06-24 DIAGNOSIS — Z3A38 38 weeks gestation of pregnancy: Secondary | ICD-10-CM | POA: Diagnosis not present

## 2018-06-24 LAB — FETAL NONSTRESS TEST

## 2018-06-24 NOTE — Progress Notes (Signed)
ROB NST 

## 2018-06-24 NOTE — Progress Notes (Signed)
    Routine Prenatal Care Visit  Subjective  Annette Lawson is a 33 y.o. G2P1001 at [redacted]w[redacted]d being seen today for ongoing prenatal care.  She is currently monitored for the following issues for this high-risk pregnancy and has ADD (attention deficit disorder); Vitamin D deficiency; Right upper quadrant abdominal pain; Multinodular goiter; Supervision of high risk pregnancy, antepartum; Rh negative state in antepartum period; History of cholestasis during pregnancy; Depression affecting pregnancy; History of postpartum hemorrhage, currently pregnant; Back pain affecting pregnancy; and GBS (group B Streptococcus carrier), +RV culture, currently pregnant on their problem list.  ----------------------------------------------------------------------------------- Patient reports one episode of itching two days ago, relieved by hydroxyzine. Contractions: Irregular. Vag. Bleeding: None.  Movement: Present. No leaking of fluid.  ----------------------------------------------------------------------------------- The following portions of the patient's history were reviewed and updated as appropriate: allergies, current medications, past family history, past medical history, past social history, past surgical history and problem list. Problem list updated.   Objective  Blood pressure 122/64, weight 218 lb (98.9 kg), last menstrual period 09/29/2017, unknown if currently breastfeeding. Pregravid weight 190 lb (86.2 kg) Total Weight Gain 28 lb (12.7 kg)  Fetal Status: Fetal Heart Rate (bpm): 140   Movement: Present  Presentation: Vertex  General:  Alert, oriented and cooperative. Patient is in no acute distress.  Skin: Skin is warm and dry. No rash noted.   Cardiovascular: Normal heart rate noted  Respiratory: Normal respiratory effort, no problems with respiration noted  Abdomen: Soft, gravid, appropriate for gestational age. Pain/Pressure: Present     Pelvic:  Cervical exam performed Dilation: 2  Effacement (%): 60 Station: -3  Extremities: Normal range of motion.     Mental Status: Normal mood and affect. Normal behavior. Normal judgment and thought content.   NST Baseline: 140 Variability: moderate Accelerations: present Decelerations: absent Tocometry: not done The patient was monitored for 20 minutes, fetal heart rate tracing was deemed reactive.  Assessment   33 y.o. G2P1001 at [redacted]w[redacted]d, EDD 07/06/2018 by Last Menstrual Period presenting for a routine prenatal visit.  Plan   pregnancy Problems (from 11/20/17 to present)    Problem Noted Resolved   Rh negative state in antepartum period 11/20/2017 by Will Bonnet, MD No   History of cholestasis during pregnancy 11/20/2017 by Will Bonnet, MD No   Depression affecting pregnancy 11/20/2017 by Will Bonnet, MD No   History of postpartum hemorrhage, currently pregnant 11/20/2017 by Will Bonnet, MD No      Discussed with MD, can return to regular prenatal care interval and discontinue NSTs in setting of normal labs.  Please refer to After Visit Summary for other counseling recommendations.   Return in about 1 week (around 07/01/2018) for ROB.  [name], CNM 06/24/2018  9:49 AM

## 2018-06-24 NOTE — Patient Instructions (Signed)
Third Trimester of Pregnancy The third trimester is from week 28 through week 40 (months 7 through 9). The third trimester is a time when the unborn baby (fetus) is growing rapidly. At the end of the ninth month, the fetus is about 20 inches in length and weighs 6-10 pounds. Body changes during your third trimester Your body will continue to go through many changes during pregnancy. The changes vary from woman to woman. During the third trimester:  Your weight will continue to increase. You can expect to gain 25-35 pounds (11-16 kg) by the end of the pregnancy.  You may begin to get stretch marks on your hips, abdomen, and breasts.  You may urinate more often because the fetus is moving lower into your pelvis and pressing on your bladder.  You may develop or continue to have heartburn. This is caused by increased hormones that slow down muscles in the digestive tract.  You may develop or continue to have constipation because increased hormones slow digestion and cause the muscles that push waste through your intestines to relax.  You may develop hemorrhoids. These are swollen veins (varicose veins) in the rectum that can itch or be painful.  You may develop swollen, bulging veins (varicose veins) in your legs.  You may have increased body aches in the pelvis, back, or thighs. This is due to weight gain and increased hormones that are relaxing your joints.  You may have changes in your hair. These can include thickening of your hair, rapid growth, and changes in texture. Some women also have hair loss during or after pregnancy, or hair that feels dry or thin. Your hair will most likely return to normal after your baby is born.  Your breasts will continue to grow and they will continue to become tender. A yellow fluid (colostrum) may leak from your breasts. This is the first milk you are producing for your baby.  Your belly button may stick out.  You may notice more swelling in your hands,  face, or ankles.  You may have increased tingling or numbness in your hands, arms, and legs. The skin on your belly may also feel numb.  You may feel short of breath because of your expanding uterus.  You may have more problems sleeping. This can be caused by the size of your belly, increased need to urinate, and an increase in your body's metabolism.  You may notice the fetus "dropping," or moving lower in your abdomen (lightening).  You may have increased vaginal discharge.  You may notice your joints feel loose and you may have pain around your pelvic bone. What to expect at prenatal visits You will have prenatal exams every 2 weeks until week 36. Then you will have weekly prenatal exams. During a routine prenatal visit:  You will be weighed to make sure you and the baby are growing normally.  Your blood pressure will be taken.  Your abdomen will be measured to track your baby's growth.  The fetal heartbeat will be listened to.  Any test results from the previous visit will be discussed.  You may have a cervical check near your due date to see if your cervix has softened or thinned (effaced).  You will be tested for Group B streptococcus. This happens between 35 and 37 weeks. Your health care provider may ask you:  What your birth plan is.  How you are feeling.  If you are feeling the baby move.  If you have had any abnormal   symptoms, such as leaking fluid, bleeding, severe headaches, or abdominal cramping.  If you are using any tobacco products, including cigarettes, chewing tobacco, and electronic cigarettes.  If you have any questions. Other tests or screenings that may be performed during your third trimester include:  Blood tests that check for low iron levels (anemia).  Fetal testing to check the health, activity level, and growth of the fetus. Testing is done if you have certain medical conditions or if there are problems during the pregnancy.  Nonstress test  (NST). This test checks the health of your baby to make sure there are no signs of problems, such as the baby not getting enough oxygen. During this test, a belt is placed around your belly. The baby is made to move, and its heart rate is monitored during movement. What is false labor? False labor is a condition in which you feel small, irregular tightenings of the muscles in the womb (contractions) that usually go away with rest, changing position, or drinking water. These are called Braxton Hicks contractions. Contractions may last for hours, days, or even weeks before true labor sets in. If contractions come at regular intervals, become more frequent, increase in intensity, or become painful, you should see your health care provider. What are the signs of labor?  Abdominal cramps.  Regular contractions that start at 10 minutes apart and become stronger and more frequent with time.  Contractions that start on the top of the uterus and spread down to the lower abdomen and back.  Increased pelvic pressure and dull back pain.  A watery or bloody mucus discharge that comes from the vagina.  Leaking of amniotic fluid. This is also known as your "water breaking." It could be a slow trickle or a gush. Let your health care provider know if it has a color or strange odor. If you have any of these signs, call your health care provider right away, even if it is before your due date. Follow these instructions at home: Medicines  Follow your health care provider's instructions regarding medicine use. Specific medicines may be either safe or unsafe to take during pregnancy.  Take a prenatal vitamin that contains at least 600 micrograms (mcg) of folic acid.  If you develop constipation, try taking a stool softener if your health care provider approves. Eating and drinking   Eat a balanced diet that includes fresh fruits and vegetables, whole grains, good sources of protein such as meat, eggs, or tofu,  and low-fat dairy. Your health care provider will help you determine the amount of weight gain that is right for you.  Avoid raw meat and uncooked cheese. These carry germs that can cause birth defects in the baby.  If you have low calcium intake from food, talk to your health care provider about whether you should take a daily calcium supplement.  Eat four or five small meals rather than three large meals a day.  Limit foods that are high in fat and processed sugars, such as fried and sweet foods.  To prevent constipation: ? Drink enough fluid to keep your urine clear or pale yellow. ? Eat foods that are high in fiber, such as fresh fruits and vegetables, whole grains, and beans. Activity  Exercise only as directed by your health care provider. Most women can continue their usual exercise routine during pregnancy. Try to exercise for 30 minutes at least 5 days a week. Stop exercising if you experience uterine contractions.  Avoid heavy lifting.  Do   not exercise in extreme heat or humidity, or at high altitudes.  Wear low-heel, comfortable shoes.  Practice good posture.  You may continue to have sex unless your health care provider tells you otherwise. Relieving pain and discomfort  Take frequent breaks and rest with your legs elevated if you have leg cramps or low back pain.  Take warm sitz baths to soothe any pain or discomfort caused by hemorrhoids. Use hemorrhoid cream if your health care provider approves.  Wear a good support bra to prevent discomfort from breast tenderness.  If you develop varicose veins: ? Wear support pantyhose or compression stockings as told by your healthcare provider. ? Elevate your feet for 15 minutes, 3-4 times a day. Prenatal care  Write down your questions. Take them to your prenatal visits.  Keep all your prenatal visits as told by your health care provider. This is important. Safety  Wear your seat belt at all times when driving.  Make  a list of emergency phone numbers, including numbers for family, friends, the hospital, and police and fire departments. General instructions  Avoid cat litter boxes and soil used by cats. These carry germs that can cause birth defects in the baby. If you have a cat, ask someone to clean the litter box for you.  Do not travel far distances unless it is absolutely necessary and only with the approval of your health care provider.  Do not use hot tubs, steam rooms, or saunas.  Do not drink alcohol.  Do not use any products that contain nicotine or tobacco, such as cigarettes and e-cigarettes. If you need help quitting, ask your health care provider.  Do not use any medicinal herbs or unprescribed drugs. These chemicals affect the formation and growth of the baby.  Do not douche or use tampons or scented sanitary pads.  Do not cross your legs for long periods of time.  To prepare for the arrival of your baby: ? Take prenatal classes to understand, practice, and ask questions about labor and delivery. ? Make a trial run to the hospital. ? Visit the hospital and tour the maternity area. ? Arrange for maternity or paternity leave through employers. ? Arrange for family and friends to take care of pets while you are in the hospital. ? Purchase a rear-facing car seat and make sure you know how to install it in your car. ? Pack your hospital bag. ? Prepare the baby's nursery. Make sure to remove all pillows and stuffed animals from the baby's crib to prevent suffocation.  Visit your dentist if you have not gone during your pregnancy. Use a soft toothbrush to brush your teeth and be gentle when you floss. Contact a health care provider if:  You are unsure if you are in labor or if your water has broken.  You become dizzy.  You have mild pelvic cramps, pelvic pressure, or nagging pain in your abdominal area.  You have lower back pain.  You have persistent nausea, vomiting, or  diarrhea.  You have an unusual or bad smelling vaginal discharge.  You have pain when you urinate. Get help right away if:  Your water breaks before 37 weeks.  You have regular contractions less than 5 minutes apart before 37 weeks.  You have a fever.  You are leaking fluid from your vagina.  You have spotting or bleeding from your vagina.  You have severe abdominal pain or cramping.  You have rapid weight loss or weight gain.  You have   shortness of breath with chest pain.  You notice sudden or extreme swelling of your face, hands, ankles, feet, or legs.  Your baby makes fewer than 10 movements in 2 hours.  You have severe headaches that do not go away when you take medicine.  You have vision changes. Summary  The third trimester is from week 28 through week 40, months 7 through 9. The third trimester is a time when the unborn baby (fetus) is growing rapidly.  During the third trimester, your discomfort may increase as you and your baby continue to gain weight. You may have abdominal, leg, and back pain, sleeping problems, and an increased need to urinate.  During the third trimester your breasts will keep growing and they will continue to become tender. A yellow fluid (colostrum) may leak from your breasts. This is the first milk you are producing for your baby.  False labor is a condition in which you feel small, irregular tightenings of the muscles in the womb (contractions) that eventually go away. These are called Braxton Hicks contractions. Contractions may last for hours, days, or even weeks before true labor sets in.  Signs of labor can include: abdominal cramps; regular contractions that start at 10 minutes apart and become stronger and more frequent with time; watery or bloody mucus discharge that comes from the vagina; increased pelvic pressure and dull back pain; and leaking of amniotic fluid. This information is not intended to replace advice given to you by your  health care provider. Make sure you discuss any questions you have with your health care provider. Document Released: 12/20/2000 Document Revised: 02/01/2016 Document Reviewed: 02/01/2016 Elsevier Interactive Patient Education  2019 [employer]  

## 2018-06-25 ENCOUNTER — Telehealth: Payer: Self-pay

## 2018-06-25 NOTE — Telephone Encounter (Signed)
Pt calling c/o sudden visual change - flashy silver thumbprint a little while ago ~2:45, BP 138/90; at 3:30 vision is better and BP 130/80.  [phone]  Spoke c JYS who adv pt to be seen in am in office and if worsens over night to go to L&D for eval.  Tx'd to SP to schedule.

## 2018-06-26 ENCOUNTER — Encounter: Payer: BC Managed Care – PPO | Admitting: Certified Nurse Midwife

## 2018-06-26 NOTE — Telephone Encounter (Signed)
Patient called to report her husband took her BP this morning & it is 110/80. She hasn't had any more visual changes & she has been up moving around for 30-40 minutes. She doesn't want to come in to her apt today. Per CLG, pt advised to keep apt next Monday but should report to L&D if any s&s return (elevated BP, visual changes, SOB, Chest pains). Pt understands. Apt cancelled for today.

## 2018-06-28 ENCOUNTER — Encounter: Payer: Self-pay | Admitting: *Deleted

## 2018-06-28 ENCOUNTER — Inpatient Hospital Stay
Admission: EM | Admit: 2018-06-28 | Discharge: 2018-06-30 | DRG: 806 | Disposition: A | Discharge status: Home / Self Care | Payer: BC Managed Care – PPO | Attending: Obstetrics and Gynecology | Admitting: Obstetrics and Gynecology

## 2018-06-28 ENCOUNTER — Other Ambulatory Visit: Payer: Self-pay

## 2018-06-28 DIAGNOSIS — O26893 Other specified pregnancy related conditions, third trimester: Secondary | ICD-10-CM | POA: Diagnosis present

## 2018-06-28 DIAGNOSIS — F17200 Nicotine dependence, unspecified, uncomplicated: Secondary | ICD-10-CM | POA: Diagnosis present

## 2018-06-28 DIAGNOSIS — F909 Attention-deficit hyperactivity disorder, unspecified type: Secondary | ICD-10-CM | POA: Diagnosis present

## 2018-06-28 DIAGNOSIS — O9081 Anemia of the puerperium: Secondary | ICD-10-CM | POA: Diagnosis not present

## 2018-06-28 DIAGNOSIS — O0990 Supervision of high risk pregnancy, unspecified, unspecified trimester: Secondary | ICD-10-CM

## 2018-06-28 DIAGNOSIS — D62 Acute posthemorrhagic anemia: Secondary | ICD-10-CM | POA: Diagnosis not present

## 2018-06-28 DIAGNOSIS — O09299 Supervision of pregnancy with other poor reproductive or obstetric history, unspecified trimester: Secondary | ICD-10-CM

## 2018-06-28 DIAGNOSIS — O99340 Other mental disorders complicating pregnancy, unspecified trimester: Secondary | ICD-10-CM

## 2018-06-28 DIAGNOSIS — Z3A38 38 weeks gestation of pregnancy: Secondary | ICD-10-CM

## 2018-06-28 DIAGNOSIS — O99334 Smoking (tobacco) complicating childbirth: Secondary | ICD-10-CM | POA: Diagnosis present

## 2018-06-28 DIAGNOSIS — O99824 Streptococcus B carrier state complicating childbirth: Principal | ICD-10-CM | POA: Diagnosis present

## 2018-06-28 DIAGNOSIS — F32A Depression, unspecified: Secondary | ICD-10-CM | POA: Diagnosis present

## 2018-06-28 DIAGNOSIS — Z6791 Unspecified blood type, Rh negative: Secondary | ICD-10-CM

## 2018-06-28 DIAGNOSIS — O26899 Other specified pregnancy related conditions, unspecified trimester: Secondary | ICD-10-CM

## 2018-06-28 DIAGNOSIS — Z1159 Encounter for screening for other viral diseases: Secondary | ICD-10-CM | POA: Diagnosis not present

## 2018-06-28 DIAGNOSIS — O99820 Streptococcus B carrier state complicating pregnancy: Secondary | ICD-10-CM

## 2018-06-28 DIAGNOSIS — Z8759 Personal history of other complications of pregnancy, childbirth and the puerperium: Secondary | ICD-10-CM

## 2018-06-28 DIAGNOSIS — O99344 Other mental disorders complicating childbirth: Secondary | ICD-10-CM | POA: Diagnosis present

## 2018-06-28 DIAGNOSIS — Z8719 Personal history of other diseases of the digestive system: Secondary | ICD-10-CM

## 2018-06-28 DIAGNOSIS — F329 Major depressive disorder, single episode, unspecified: Secondary | ICD-10-CM | POA: Diagnosis present

## 2018-06-28 DIAGNOSIS — F988 Other specified behavioral and emotional disorders with onset usually occurring in childhood and adolescence: Secondary | ICD-10-CM | POA: Diagnosis present

## 2018-06-28 LAB — CBC
HCT: 34.8 % — ABNORMAL LOW (ref 36.0–46.0)
Hemoglobin: 11.5 g/dL — ABNORMAL LOW (ref 12.0–15.0)
MCH: 28.3 pg (ref 26.0–34.0)
MCHC: 33 g/dL (ref 30.0–36.0)
MCV: 85.7 fL (ref 80.0–100.0)
Platelets: 301 10*3/uL (ref 150–400)
RBC: 4.06 MIL/uL (ref 3.87–5.11)
RDW: 12.8 % (ref 11.5–15.5)
WBC: 17.1 10*3/uL — ABNORMAL HIGH (ref 4.0–10.5)
nRBC: 0 % (ref 0.0–0.2)

## 2018-06-28 LAB — ABO/RH: ABO/RH(D): O NEG

## 2018-06-28 LAB — TYPE AND SCREEN
ABO/RH(D): O NEG
Antibody Screen: POSITIVE

## 2018-06-28 LAB — SARS CORONAVIRUS 2 BY RT PCR (HOSPITAL ORDER, PERFORMED IN ~~LOC~~ HOSPITAL LAB): SARS Coronavirus 2: NEGATIVE

## 2018-06-28 MED ORDER — PRENATAL MULTIVITAMIN CH
1.0000 | ORAL_TABLET | Freq: Every day | ORAL | Status: DC
  Administered 2018-06-28 – 2018-06-29 (×2): 1 via ORAL
  Filled 2018-06-28 (×2): qty 1

## 2018-06-28 MED ORDER — MISOPROSTOL 200 MCG PO TABS
ORAL_TABLET | ORAL | Status: AC
  Filled 2018-06-28: qty 4

## 2018-06-28 MED ORDER — ACETAMINOPHEN 325 MG PO TABS: 650.0000 mg | ORAL_TABLET | ORAL | Status: DC | PRN

## 2018-06-28 MED ORDER — LACTATED RINGERS IV SOLN
INTRAVENOUS | Status: DC
  Administered 2018-06-28: 06:00:00 via INTRAVENOUS

## 2018-06-28 MED ORDER — SIMETHICONE 80 MG PO CHEW: 80.0000 mg | CHEWABLE_TABLET | ORAL | Status: DC | PRN

## 2018-06-28 MED ORDER — OXYTOCIN 10 UNIT/ML IJ SOLN: 10.0000 [IU] | Freq: Once | INTRAMUSCULAR | Status: DC

## 2018-06-28 MED ORDER — ONDANSETRON HCL 4 MG PO TABS: 4.0000 mg | ORAL_TABLET | ORAL | Status: DC | PRN

## 2018-06-28 MED ORDER — OXYTOCIN 40 UNITS IN NORMAL SALINE INFUSION - SIMPLE MED
INTRAVENOUS | Status: AC
  Filled 2018-06-28: qty 1000

## 2018-06-28 MED ORDER — LIDOCAINE HCL (PF) 1 % IJ SOLN: 30.0000 mL | INTRAMUSCULAR | Status: DC | PRN

## 2018-06-28 MED ORDER — LACTATED RINGERS IV SOLN: 500.0000 mL | INTRAVENOUS | Status: DC | PRN

## 2018-06-28 MED ORDER — IBUPROFEN 600 MG PO TABS
600.0000 mg | ORAL_TABLET | Freq: Four times a day (QID) | ORAL | Status: DC
  Administered 2018-06-28 – 2018-06-30 (×7): 600 mg via ORAL
  Filled 2018-06-28 (×8): qty 1

## 2018-06-28 MED ORDER — DIPHENHYDRAMINE HCL 25 MG PO CAPS: 25.0000 mg | ORAL_CAPSULE | Freq: Four times a day (QID) | ORAL | Status: DC | PRN

## 2018-06-28 MED ORDER — OXYTOCIN BOLUS FROM INFUSION
500.0000 mL | Freq: Once | INTRAVENOUS | Status: AC
  Administered 2018-06-28: 07:00:00 500 mL via INTRAVENOUS

## 2018-06-28 MED ORDER — SENNOSIDES-DOCUSATE SODIUM 8.6-50 MG PO TABS
2.0000 | ORAL_TABLET | ORAL | Status: DC
  Administered 2018-06-29: 2 via ORAL
  Filled 2018-06-28: qty 2

## 2018-06-28 MED ORDER — AMMONIA AROMATIC IN INHA
RESPIRATORY_TRACT | Status: AC
  Filled 2018-06-28: qty 10

## 2018-06-28 MED ORDER — ONDANSETRON HCL 4 MG/2ML IJ SOLN: 4.0000 mg | INTRAMUSCULAR | Status: DC | PRN

## 2018-06-28 MED ORDER — AMPHETAMINE-DEXTROAMPHETAMINE 5 MG PO TABS
10.0000 mg | ORAL_TABLET | Freq: Every day | ORAL | Status: DC
  Administered 2018-06-29 – 2018-06-30 (×2): 10 mg via ORAL
  Filled 2018-06-28 (×4): qty 2

## 2018-06-28 MED ORDER — COCONUT OIL OIL
1.0000 "application " | TOPICAL_OIL | Status: DC | PRN
  Administered 2018-06-30: 1 via TOPICAL
  Filled 2018-06-28: qty 120

## 2018-06-28 MED ORDER — FERROUS SULFATE 325 (65 FE) MG PO TABS
325.0000 mg | ORAL_TABLET | Freq: Two times a day (BID) | ORAL | Status: DC
  Administered 2018-06-28 – 2018-06-30 (×3): 325 mg via ORAL
  Filled 2018-06-28 (×3): qty 1

## 2018-06-28 MED ORDER — OXYTOCIN 10 UNIT/ML IJ SOLN
INTRAMUSCULAR | Status: AC
  Filled 2018-06-28: qty 2

## 2018-06-28 MED ORDER — SODIUM CHLORIDE 0.9 % IV SOLN
5.0000 10*6.[IU] | Freq: Once | INTRAVENOUS | Status: AC
  Administered 2018-06-28: 06:00:00 5 10*6.[IU] via INTRAVENOUS
  Filled 2018-06-28: qty 5

## 2018-06-28 MED ORDER — SOD CITRATE-CITRIC ACID 500-334 MG/5ML PO SOLN: 30.0000 mL | ORAL | Status: DC | PRN

## 2018-06-28 MED ORDER — ONDANSETRON HCL 4 MG/2ML IJ SOLN: 4.0000 mg | Freq: Four times a day (QID) | INTRAMUSCULAR | Status: DC | PRN

## 2018-06-28 MED ORDER — BENZOCAINE-MENTHOL 20-0.5 % EX AERO
1.0000 "application " | INHALATION_SPRAY | CUTANEOUS | Status: DC | PRN
  Administered 2018-06-28: 1 via TOPICAL
  Filled 2018-06-28: qty 56

## 2018-06-28 MED ORDER — DIBUCAINE (PERIANAL) 1 % EX OINT: 1.0000 "application " | TOPICAL_OINTMENT | CUTANEOUS | Status: DC | PRN

## 2018-06-28 MED ORDER — LIDOCAINE HCL (PF) 1 % IJ SOLN
INTRAMUSCULAR | Status: AC
  Filled 2018-06-28: qty 30

## 2018-06-28 MED ORDER — OXYTOCIN 40 UNITS IN NORMAL SALINE INFUSION - SIMPLE MED
2.5000 [IU]/h | INTRAVENOUS | Status: DC
  Filled 2018-06-28: qty 1000

## 2018-06-28 MED ORDER — HYDROCODONE-ACETAMINOPHEN 5-325 MG PO TABS
1.0000 | ORAL_TABLET | ORAL | Status: DC | PRN
  Administered 2018-06-28 – 2018-06-30 (×11): 1 via ORAL
  Filled 2018-06-28 (×11): qty 1

## 2018-06-28 MED ORDER — WITCH HAZEL-GLYCERIN EX PADS: 1.0000 "application " | MEDICATED_PAD | CUTANEOUS | Status: DC | PRN

## 2018-06-28 MED ORDER — PENICILLIN G 3 MILLION UNITS IVPB - SIMPLE MED: 3.0000 10*6.[IU] | INTRAVENOUS | Status: DC

## 2018-06-28 NOTE — Discharge Summary (Signed)
OB Discharge Summary     Patient Name: Annette Lawson DOB: 14-Jan-1985 MRN: [id number]  Date of admission: 06/28/2018 Delivering MD: Prentice Docker, MD  Date of Delivery: 06/28/2018  Date of discharge: 06/30/2018  Admitting diagnosis: Pregnancy - contractions Intrauterine pregnancy: [redacted]w[redacted]d     Secondary diagnosis: None     Discharge diagnosis: Term Pregnancy Delivered                                                                                                Post partum procedures:rhogam  Augmentation: none  Complications: None  Hospital course:  Onset of Labor With Vaginal Delivery     33 y.o. yo G2P1001 at 104w6d was admitted in Active Labor on 06/28/2018. Patient had an uncomplicated labor course as follows:  Membrane Rupture Time/Date:  ,   Intrapartum Procedures: Episiotomy: None [1]                                         Lacerations:  First degree vaginal  Patient had a delivery of a Viable infant. 06/28/2018  Information for the patient's newborn:  Lorijean, Husser [[id number]]  Delivery Method: Vag-Spont   See delivery note for further details of delivery.   Patient had an uncomplicated postpartum course.  She is ambulating, tolerating a regular diet, has had a BM, and urinating well. Patient is discharged home in stable condition on 06/30/18.   Physical exam  Vitals:   06/29/18 0849 06/29/18 1622 06/29/18 2302 06/30/18 0749  BP: 119/79 112/66 105/72 122/74  Pulse: 60 69 68 62  Resp: 20 18 18 20   Temp: 97.7 F (36.5 C) 98.4 F (36.9 C) 98 F (36.7 C) 97.8 F (36.6 C)  TempSrc: Oral Oral Oral Oral  SpO2: 100%  99% 99%  Weight:      Height:       General: alert, cooperative and no distress Lochia: appropriate Uterine Fundus: firm Incision: N/A DVT Evaluation: No evidence of DVT seen on physical exam.  Labs: Lab Results  Component Value Date   WBC 13.7 (H) 06/29/2018   HGB 10.4 (L) 06/29/2018   HCT 32.4 (L) 06/29/2018   MCV 88.5 06/29/2018   PLT  273 06/29/2018    Discharge instruction: per After Visit Summary.  Medications:  Allergies as of 06/30/2018   No Known Allergies     Medication List    TAKE these medications   acetaminophen 500 MG tablet Commonly known as: TYLENOL Take 500 mg by mouth every 6 (six) hours as needed for mild pain or headache.   amphetamine-dextroamphetamine 10 MG tablet Commonly known as: ADDERALL Take 10 mg by mouth daily with breakfast. Twice a day.   diphenhydrAMINE 25 MG tablet Commonly known as: BENADRYL Take 25 mg by mouth every 6 (six) hours as needed.   hydrOXYzine 25 MG tablet Commonly known as: ATARAX/VISTARIL Take 1 tablet (25 mg total) by mouth every 6 (six) hours as needed for itching.   ibuprofen 600 MG tablet Commonly known as: ADVIL Take  1 tablet (600 mg total) by mouth every 6 (six) hours as needed for moderate pain.   oxyCODONE 5 MG immediate release tablet Commonly known as: Roxicodone Take 1 tablet (5 mg total) by mouth every 6 (six) hours as needed for up to 3 days for severe pain.   prenatal multivitamin Tabs tablet Take 1 tablet by mouth daily at 12 noon.       Diet: routine diet  Activity: Advance as tolerated. Pelvic rest for 6 weeks.   Outpatient follow up: Follow-up Information    Will Bonnet, MD. Schedule an appointment as soon as possible for a visit in 2 week(s).   Specialty: Obstetrics and Gynecology Why: Postpartum mood check Contact information: [address] [place] [address] [phone]             Postpartum contraception: Vasectomy, possibility of interval BTL instead Rhogam Given postpartum: yes Rubella vaccine given postpartum: no Varicella vaccine given postpartum: no TDaP given antepartum or postpartum: given 05/21/2018  Newborn Data: Live born female  Birth Weight:  3660 g APGAR: 8, 9  Newborn Delivery   Birth date/time: 06/28/2018 06:34:00 Delivery type: Vaginal, Spontaneous     Baby Feeding:  Breast  Disposition:home with mother  SIGNED: Christean Leaf, [facility] 06/30/2018, 9:41 AM

## 2018-06-28 NOTE — OB Triage Note (Signed)
Recvd pt from ED. Pt thinks water broke last night around 2200 and c/o contractions every 5-8 min that started around 2300. Pt has pink tinged discharge and continuous LOF. Pt feeling baby move well.

## 2018-06-28 NOTE — Lactation Note (Signed)
This note was copied from a baby's chart. Lactation Consultation Note  Patient Name: Annette Lawson [id number] Date: 06/28/2018 Reason for consult: Initial assessment   Maternal Data Formula Feeding for Exclusion: No Has patient been taught Hand Expression?: Yes Does the patient have breastfeeding experience prior to this delivery?: Yes breasttfed first child x 6 mths Feeding Feeding Type: Breast Fed  LATCH Score Latch: Grasps breast easily, tongue down, lips flanged, rhythmical sucking.  Audible Swallowing: Spontaneous and intermittent  Type of Nipple: Everted at rest and after stimulation  Comfort (Breast/Nipple): Soft / non-tender  Hold (Positioning): Assistance needed to correctly position infant at breast and maintain latch.  LATCH Score: 9  Interventions Interventions: Adjust position;Support pillows  Lactation Tools Discussed/Used WIC Program: No   Consult Status Consult Status: PRN    Ferol Luz 06/28/2018, 12:03 PM

## 2018-06-28 NOTE — H&P (Signed)
OB History & Physical   History of Present Illness:  Chief Complaint: contractions  HPI:  Annette Lawson is a 33 y.o. G2P1001 female at [redacted]w[redacted]d dated by LMP consistent with 7 week ultrasound.  Her pregnancy has been complicated by Rh negative, ADHD for which she was taking Adderall.    She reports contractions.   She denies leakage of fluid.   She denies vaginal bleeding.   She reports fetal movement.    Maternal Medical History:   Past Medical History:  Diagnosis Date  . ADD (attention deficit disorder)   . Cholestasis during pregnancy   . Depression   . Vaginal Pap smear, abnormal     Past Surgical History:  Procedure Laterality Date  . WISDOM TOOTH EXTRACTION     Allergies: No Known Allergies  Prior to Admission medications   Medication Sig Start Date End Date Taking? Authorizing Provider  acetaminophen (TYLENOL) 500 MG tablet Take 500 mg by mouth every 6 (six) hours as needed for mild pain or headache.   Yes [provider]  amphetamine-dextroamphetamine (ADDERALL) 10 MG tablet Take 10 mg by mouth daily with breakfast. Twice a day.   Yes [provider]  hydrOXYzine (ATARAX/VISTARIL) 25 MG tablet Take 1 tablet (25 mg total) by mouth every 6 (six) hours as needed for itching. 06/04/18  Yes Rexene Agent, CNM  Prenatal Vit-Fe Fumarate-FA (PRENATAL MULTIVITAMIN) TABS tablet Take 1 tablet by mouth daily at 12 noon. 12/04/15  Yes Juliene Pina, CNM  diphenhydrAMINE (BENADRYL) 25 MG tablet Take 25 mg by mouth every 6 (six) hours as needed.    [provider]    OB History  Gravida Para Term Preterm AB Living  2 1 1     1   SAB TAB Ectopic Multiple Live Births        0 1    # Outcome Date GA Lbr Len/2nd Weight Sex Delivery Anes PTL Lv  2 Current           1 Term 12/01/15 [redacted]w[redacted]d 07:40 / 02:01 3090 g F Vag-Spont EPI  LIV    Prenatal care site: [facility]  Social History: She  reports that she has been smoking. She has never used smokeless  tobacco. She reports current alcohol use. She reports that she does not use drugs.  Family History: family history includes COPD in her maternal grandmother; Hypothyroidism in her mother.   Review of Systems:  Review of Systems  Constitutional: Negative.   HENT: Negative.   Eyes: Negative.   Respiratory: Negative.   Cardiovascular: Negative.   Gastrointestinal: Negative.   Genitourinary: Negative.   Musculoskeletal: Negative.   Skin: Negative.   Neurological: Negative.   Psychiatric/Behavioral: Negative.      Physical Exam:  Physical Exam Constitutional:      General: She is in acute distress (mild).     Appearance: Normal appearance.  Genitourinary:     Genitourinary Comments: 3 cm to 4 cm -> quickly to 10 cm (by RN)  HENT:     Head: Normocephalic and atraumatic.  Eyes:     General: No scleral icterus.    Conjunctiva/sclera: Conjunctivae normal.  Cardiovascular:     Rate and Rhythm: Normal rate and regular rhythm.  Pulmonary:     Effort: Pulmonary effort is normal.     Breath sounds: Normal breath sounds. No wheezing, rhonchi or rales.  Abdominal:     Comments: gravid  Musculoskeletal: Normal range of motion.  General: No swelling.  Neurological:     General: No focal deficit present.     Mental Status: She is alert and oriented to person, place, and time.     Cranial Nerves: No cranial nerve deficit.  Skin:    General: Skin is warm and dry.     Coloration: Skin is not jaundiced.  Psychiatric:        Mood and Affect: Mood normal.        Behavior: Behavior normal.        Judgment: Judgment normal.  Vitals signs and nursing note reviewed.      Pertinent Results:  Prenatal Labs: Blood type/Rh O negative  Antibody screen negative  Rubella Immune  Varicella Immune    RPR NR  HBsAg negative  HIV negative  GC negative  Chlamydia negative  Genetic screening declines  1 hour GTT Early 95, 28 week = 105  3 hour GTT n/a  GBS positive    Baseline  FHR: 125 beats/min   Variability: moderate   Accelerations: present   Decelerations: absent Contractions: present frequency: 3 q 10 Overall assessment: cat 1  Lab Results  Component Value Date   [place] NEGATIVE 06/28/2018  ]  Assessment:  Annette Lawson is a 33 y.o. G97P1001 female at [redacted]w[redacted]d with normal labor.  The patient quickly went on to deliver. See my delivery summary..   Plan:  1. Admit to Labor & Delivery  2. CBC, T&S, Clrs, IVF 3. GBS positive.  - antibiotics ordered, but she did not complete the course. She went from 4 cm to delivery in 1 hour 4. [place]etwal well-being: reassuring   Prentice Docker, MD 06/28/2018 7:01 AM

## 2018-06-29 ENCOUNTER — Encounter: Payer: Self-pay | Admitting: Obstetrics and Gynecology

## 2018-06-29 LAB — CBC
HCT: 32.4 % — ABNORMAL LOW (ref 36.0–46.0)
Hemoglobin: 10.4 g/dL — ABNORMAL LOW (ref 12.0–15.0)
MCH: 28.4 pg (ref 26.0–34.0)
MCHC: 32.1 g/dL (ref 30.0–36.0)
MCV: 88.5 fL (ref 80.0–100.0)
Platelets: 273 10*3/uL (ref 150–400)
RBC: 3.66 MIL/uL — ABNORMAL LOW (ref 3.87–5.11)
RDW: 12.9 % (ref 11.5–15.5)
WBC: 13.7 10*3/uL — ABNORMAL HIGH (ref 4.0–10.5)
nRBC: 0 % (ref 0.0–0.2)

## 2018-06-29 LAB — RPR: RPR Ser Ql: NONREACTIVE

## 2018-06-29 LAB — FETAL SCREEN: Fetal Screen: NEGATIVE

## 2018-06-29 MED ORDER — RHO D IMMUNE GLOBULIN 1500 UNIT/2ML IJ SOSY
300.0000 ug | PREFILLED_SYRINGE | Freq: Once | INTRAMUSCULAR | Status: AC
  Administered 2018-06-29: 300 ug via INTRAVENOUS
  Filled 2018-06-29: qty 2

## 2018-06-29 NOTE — Lactation Note (Signed)
This note was copied from a baby's chart. Lactation Consultation Note  Patient Name: Annette Lawson Date: 06/29/2018 Reason for consult: Follow-up assessment;Early term 76-38.6wks  Mom concerned that Annette Lawson is cluster feeding, but keeps falling asleep after 5 minutes.  When NBHS and Newborn screening were completed, Annette Lawson was put back to the breast.  Mom can easily hand express colostrum.  Observed him latch on to left breast in football hold without difficulty and begin strong rhythmic sucking with swallows.  Demonstrated waking techniques while Annette Lawson is at the breast such as massaging the breast and gently stimulating him to keep him actively sucking.  He nursed for over 20 minutes at this feeding with nutritive sucking.  Reviewed supply and demand, tummy size, feeding cues, normal course of lactation and routine newborn feeding patterns.  Mom plans to go back to work at [employer] so had questions about when to initiate pumping which were addressed.  Lactation name and number written on white board and encouraged to call with any questions, concerns or assistance.  Maternal Data Formula Feeding for Exclusion: No Has patient been taught Hand Expression?: Yes(Mom can easily hand express colostrum) Does the patient have breastfeeding experience prior to this delivery?: Yes  Feeding Feeding Type: Breast Fed  LATCH Score Latch: Grasps breast easily, tongue down, lips flanged, rhythmical sucking.  Audible Swallowing: A few with stimulation  Type of Nipple: Everted at rest and after stimulation  Comfort (Breast/Nipple): Soft / non-tender  Hold (Positioning): No assistance needed to correctly position infant at breast.  LATCH Score: 9  Interventions Interventions: Breast feeding basics reviewed;Breast massage;Hand express;Breast compression;Support pillows  Lactation Tools Discussed/Used [place] [place] Program: [employer])   Consult Status Consult Status:  PRN    Jarold Motto 06/29/2018, 10:23 AM

## 2018-06-29 NOTE — Progress Notes (Signed)
Subjective:  Doing well postpartum day 1. She is ambulating and voiding without difficulty. She is tolerating regular diet and her pain is controlled with PO medication. She reports breastfeeding is going well.  Objective:  Vital signs in last 24 hours: Temp:  [97.6 F (36.4 C)-98.4 F (36.9 C)] 97.7 F (36.5 C) (06/20 0849) Pulse Rate:  [60-83] 60 (06/20 0849) Resp:  [17-20] 20 (06/20 0849) BP: (100-130)/(60-81) 119/79 (06/20 0849) SpO2:  [97 %-100 %] 100 % (06/20 0849)    General: NAD Pulmonary: no increased work of breathing Abdomen: non-distended, non-tender, fundus firm at level of umbilicus Extremities: no edema, no erythema, no tenderness  Results for orders placed or performed during the hospital encounter of 06/28/18 (from the past 72 hour(s))  CBC     Status: Abnormal   Collection Time: 06/28/18  5:28 AM  Result Value Ref Range   WBC 17.1 (H) 4.0 - 10.5 K/uL   RBC 4.06 3.87 - 5.11 MIL/uL   Hemoglobin 11.5 (L) 12.0 - 15.0 g/dL   HCT 34.8 (L) 36.0 - 46.0 %   MCV 85.7 80.0 - 100.0 fL   MCH 28.3 26.0 - 34.0 pg   MCHC 33.0 30.0 - 36.0 g/dL   RDW 12.8 11.5 - 15.5 %   Platelets 301 150 - 400 K/uL   nRBC 0.0 0.0 - 0.2 %    Comment: Performed at [facility], [address] [place], [place] [address]  SARS Coronavirus 2 (CEPHEID - Performed in [place] hospital lab), Hosp Order     Status: None   Collection Time: 06/28/18  5:49 AM   Specimen: Nasopharyngeal Swab  Result Value Ref Range   SARS Coronavirus 2 NEGATIVE NEGATIVE    Comment: (NOTE) If result is NEGATIVE SARS-CoV-2 target nucleic acids are NOT DETECTED. The SARS-CoV-2 RNA is generally detectable in upper and lower  respiratory specimens during the acute phase of infection. The lowest  concentration of SARS-CoV-2 viral copies this assay can detect is 250  copies / mL. A negative result does not preclude SARS-CoV-2 infection  and should not be used as the sole basis for treatment or other   patient management decisions.  A negative result may occur with  improper specimen collection / handling, submission of specimen other  than nasopharyngeal swab, presence of viral mutation(s) within the  areas targeted by this assay, and inadequate number of viral copies  (<250 copies / mL). A negative result must be combined with clinical  observations, patient history, and epidemiological information. If result is POSITIVE SARS-CoV-2 target nucleic acids are DETECTED. The SARS-CoV-2 RNA is generally detectable in upper and lower  respiratory specimens dur ing the acute phase of infection.  Positive  results are indicative of active infection with SARS-CoV-2.  Clinical  correlation with patient history and other diagnostic information is  necessary to determine patient infection status.  Positive results do  not rule out bacterial infection or co-infection with other viruses. If result is PRESUMPTIVE POSTIVE SARS-CoV-2 nucleic acids MAY BE PRESENT.   A presumptive positive result was obtained on the submitted specimen  and confirmed on repeat testing.  While 2019 novel coronavirus  (SARS-CoV-2) nucleic acids may be present in the submitted sample  additional confirmatory testing may be necessary for epidemiological  and / or clinical management purposes  to differentiate between  SARS-CoV-2 and other Sarbecovirus currently known to infect humans.  If clinically indicated additional testing with an alternate test  methodology [phone]) is advised. The  SARS-CoV-2 RNA is generally  detectable in upper and lower respiratory sp ecimens during the acute  phase of infection. The expected result is Negative. Fact Sheet for Patients:  StrictlyIdeas.no Fact Sheet for Healthcare Providers: BankingDealers.co.za This test is not yet approved or cleared by the [place] FDA and has been authorized for detection and/or diagnosis of SARS-CoV-2 by  FDA under an Emergency Use Authorization (EUA).  This EUA will remain in effect (meaning this test can be used) for the duration of the COVID-19 declaration under Section 564(b)(1) of the Act, 21 U.S.C. section 360bbb-3(b)(1), unless the authorization is terminated or revoked sooner. Performed at [facility], [address] [place], [place] [address]   Type and screen [place]     Status: None   Collection Time: 06/28/18  6:09 AM  Result Value Ref Range   ABO/RH(D) O NEG    Antibody Screen POS    Sample Expiration 07/01/2018,2359    Antibody Identification      PASSIVELY ACQUIRED ANTI-D Performed at [facility], [address] [place], [place] [address]   RPR     Status: None   Collection Time: 06/28/18  6:09 AM  Result Value Ref Range   RPR Ser Ql Non Reactive Non Reactive    Comment: (NOTE) Performed At: [facility] [place], [place] [id number] Rush Farmer MD OA:[id number]   ABO/Rh     Status: None   Collection Time: 06/28/18 11:44 AM  Result Value Ref Range   ABO/RH(D)      Jenetta Downer NEG Performed at [facility], [address] [place], [place] [address]   Fetal screen     Status: None   Collection Time: 06/29/18  7:45 AM  Result Value Ref Range   Fetal Screen      NEG Performed at [facility], [address] [place], [place] [address]   Rhogam injection     Status: None (Preliminary result)   Collection Time: 06/29/18  7:45 AM  Result Value Ref Range   Unit Number [id number]    Blood Component Type RHIG    Unit division 00    Status of Unit ISSUED    Transfusion Status      OK TO TRANSFUSE Performed at [facility] [place], [place] [address]   CBC     Status: Abnormal   Collection Time: 06/29/18  7:45 AM  Result Value Ref Range   WBC 13.7 (H) 4.0 - 10.5 K/uL   RBC 3.66 (L) 3.87 - 5.11 MIL/uL   Hemoglobin 10.4 (L) 12.0 - 15.0 g/dL   HCT  32.4 (L) 36.0 - 46.0 %   MCV 88.5 80.0 - 100.0 fL   MCH 28.4 26.0 - 34.0 pg   MCHC 32.1 30.0 - 36.0 g/dL   RDW 12.9 11.5 - 15.5 %   Platelets 273 150 - 400 K/uL   nRBC 0.0 0.0 - 0.2 %    Comment: Performed at [facility], [address], [place], [place] [address]    Assessment:   33 y.o. [phone] postpartum day # 1  Plan:    1) Acute blood loss anemia - hemodynamically stable and asymptomatic - po ferrous sulfate  2) O negative- Rhogam given 06/29/2018  3) Rubella Immune, Varicella Immune  4) TDAP status given antepartum  5) Feeding plan: breastfeeding  6)  Education given regarding options for contraception, as well as compatibility with breast feeding if applicable.  Patient plans on vasectomy for contraception.  7) Disposition: continue routine care, patient to discharge tomorrow following 48 hour observation of newborn due to inadequate treatment for GBS   Rod Can, [facility] 06/29/2018, 11:11 AM

## 2018-06-30 LAB — RHOGAM INJECTION: Unit division: 0

## 2018-06-30 MED ORDER — IBUPROFEN 600 MG PO TABS: 600.0000 mg | ORAL_TABLET | Freq: Four times a day (QID) | ORAL | 0 refills | Status: DC | PRN

## 2018-06-30 MED ORDER — OXYCODONE HCL 5 MG PO TABS: 5.0000 mg | ORAL_TABLET | Freq: Four times a day (QID) | ORAL | 0 refills | Status: AC | PRN

## 2018-06-30 NOTE — Progress Notes (Signed)
Discharge instructions complete and prescriptions given. Patient verbalizes understanding of teaching. Patient discharged home via wheelchair at 1200.

## 2018-07-01 ENCOUNTER — Encounter: Payer: BC Managed Care – PPO | Admitting: Obstetrics and Gynecology

## 2018-07-04 ENCOUNTER — Other Ambulatory Visit: Payer: Self-pay

## 2018-07-04 ENCOUNTER — Other Ambulatory Visit: Payer: Self-pay | Admitting: Obstetrics and Gynecology

## 2018-07-04 ENCOUNTER — Telehealth: Payer: Self-pay

## 2018-07-04 DIAGNOSIS — R102 Pelvic and perineal pain: Secondary | ICD-10-CM

## 2018-07-04 MED ORDER — OXYCODONE-ACETAMINOPHEN 5-325 MG PO TABS: 1.0000 | ORAL_TABLET | Freq: Three times a day (TID) | ORAL | 0 refills | Status: DC | PRN

## 2018-07-04 NOTE — Telephone Encounter (Signed)
Please advise 

## 2018-07-04 NOTE — Telephone Encounter (Signed)
Pt has scheduled appt c [facility] for Wed of next week.  She is calling to see if she can have a small extension to the pain med norco to get her to her appt.  [phone]

## 2018-07-04 NOTE — Telephone Encounter (Signed)
Pt calling; 6d pp; has a lot of pelvic/sacrum pain; just finished the norco - it helped but pain isn't getting better; doesn't know if needs physical therapy or not.  [phone]

## 2018-07-04 NOTE — Telephone Encounter (Signed)
Rx has been sent by me just now.

## 2018-07-05 NOTE — Telephone Encounter (Signed)
Pt aware.

## 2018-07-06 ENCOUNTER — Inpatient Hospital Stay: Admit: 2018-07-06 | Payer: Self-pay

## 2018-07-10 ENCOUNTER — Telehealth: Payer: Self-pay | Admitting: Obstetrics and Gynecology

## 2018-07-10 ENCOUNTER — Ambulatory Visit
Admission: RE | Admit: 2018-07-10 | Discharge: 2018-07-10 | Disposition: A | Discharge status: Home / Self Care | Payer: BC Managed Care – PPO | Attending: Obstetrics and Gynecology | Admitting: Obstetrics and Gynecology

## 2018-07-10 ENCOUNTER — Ambulatory Visit (INDEPENDENT_AMBULATORY_CARE_PROVIDER_SITE_OTHER): Payer: BC Managed Care – PPO | Admitting: Obstetrics and Gynecology

## 2018-07-10 ENCOUNTER — Encounter: Payer: Self-pay | Admitting: Obstetrics and Gynecology

## 2018-07-10 ENCOUNTER — Other Ambulatory Visit: Payer: Self-pay

## 2018-07-10 ENCOUNTER — Ambulatory Visit
Admission: RE | Admit: 2018-07-10 | Discharge: 2018-07-10 | Disposition: A | Discharge status: Home / Self Care | Payer: BC Managed Care – PPO | Source: Ambulatory Visit | Attending: Obstetrics and Gynecology | Admitting: Obstetrics and Gynecology

## 2018-07-10 DIAGNOSIS — M545 Low back pain, unspecified: Secondary | ICD-10-CM

## 2018-07-10 MED ORDER — CYCLOBENZAPRINE HCL 10 MG PO TABS: 10.0000 mg | ORAL_TABLET | Freq: Three times a day (TID) | ORAL | 0 refills | Status: DC | PRN

## 2018-07-10 NOTE — Telephone Encounter (Signed)
Called and spoke with someone at [facility] to schedule appointment for xray. They stated that [facility] at walk in appointment and patient doesn't need to schedule appointment. Patient aware of information

## 2018-07-10 NOTE — Progress Notes (Signed)
Obstetrics & Gynecology Office Visit   Chief Complaint:  Chief Complaint  Patient presents with  . Postpartum Care    vaginal delivery 6/19  . postpartum pain    Sacrum pain radiates down legs     History of Present Illness: 33 y.o. G27P2002 female who is postpartum day #65 from an uncomplicated, precipitous spontaneous vaginal delivery. Prior to delivery she had been having back pain.  However, after delivery she has been having pain having sacral pain and down into "my tail bone."  If she walks around she has pain that radiates around to the front. She has been able to tolerate PO. She is having normal bowel movements and is voiding.  She did have some constipation, but taking some stool softener has helped and also made her pain a little better.  She denies loss of sensation and muscle coordination.  She denies loss of strength in her less. She is breast and formula feeding.    Past Medical History:  Diagnosis Date  . ADD (attention deficit disorder)   . Cholestasis during pregnancy   . Depression   . Vaginal Pap smear, abnormal     Past Surgical History:  Procedure Laterality Date  . WISDOM TOOTH EXTRACTION      Gynecologic History: No LMP recorded.  Obstetric History: [id number]  Family History  Problem Relation Age of Onset  . Hypothyroidism Mother   . COPD Maternal Grandmother        reactive airway disease    Social History   Socioeconomic History  . Marital status: Married    Spouse name: Not on file  . Number of children: Not on file  . Years of education: Not on file  . Highest education level: Not on file  Occupational History  . Not on file  Social Needs  . Financial resource strain: Not on file  . Food insecurity    Worry: Not on file    Inability: Not on file  . Transportation needs    Medical: Not on file    Non-medical: Not on file  Tobacco Use  . Smoking status: Current Some Day Smoker  . Smokeless tobacco: Never Used  Substance and Sexual  Activity  . Alcohol use: Yes    Alcohol/week: 0.0 standard drinks    Comment: Occasional  . Drug use: No  . Sexual activity: Yes    Birth control/protection: None  Lifestyle  . Physical activity    Days per week: Not on file    Minutes per session: Not on file  . Stress: Not on file  Relationships  . Social Herbalist on phone: Not on file    Gets together: Not on file    Attends religious service: Not on file    Active member of club or organization: Not on file    Attends meetings of clubs or organizations: Not on file    Relationship status: Not on file  . Intimate partner violence    Fear of current or ex partner: Not on file    Emotionally abused: Not on file    Physically abused: Not on file    Forced sexual activity: Not on file  Other Topics Concern  . Not on file  Social History Narrative  . Not on file    No Known Allergies  Prior to Admission medications   Medication Sig Start Date End Date Taking? Authorizing Provider  acetaminophen (TYLENOL) 500 MG tablet Take 500 mg by mouth every  6 (six) hours as needed for mild pain or headache.   Yes [provider]  amphetamine-dextroamphetamine (ADDERALL) 10 MG tablet Take 10 mg by mouth daily with breakfast. Twice a day.   Yes [provider]  diphenhydrAMINE (BENADRYL) 25 MG tablet Take 25 mg by mouth every 6 (six) hours as needed.   Yes [provider]  hydrOXYzine (ATARAX/VISTARIL) 25 MG tablet Take 1 tablet (25 mg total) by mouth every 6 (six) hours as needed for itching. 06/04/18  Yes Rexene Agent, CNM  ibuprofen (ADVIL) 600 MG tablet Take 1 tablet (600 mg total) by mouth every 6 (six) hours as needed for moderate pain. 06/30/18  Yes Rod Can, CNM  oxyCODONE-acetaminophen (PERCOCET) 5-325 MG tablet Take 1 tablet by mouth every 8 (eight) hours as needed for severe pain. 07/04/18  Yes Will Bonnet, MD  Prenatal Vit-Fe Fumarate-FA (PRENATAL MULTIVITAMIN) TABS tablet Take  1 tablet by mouth daily at 12 noon. 12/04/15  Yes Juliene Pina, CNM    Review of Systems  Constitutional: Negative.  Negative for chills, fever and weight loss.  HENT: Negative.   Eyes: Negative.   Respiratory: Negative.   Cardiovascular: Negative.   Gastrointestinal: Negative.   Genitourinary: Negative.   Musculoskeletal: Positive for back pain (see HPI) and joint pain. Negative for falls, myalgias and neck pain.  Skin: Negative.   Neurological: Negative.  Negative for focal weakness and weakness.  Psychiatric/Behavioral: Negative.      Physical Exam BP 114/75   Pulse 84   Wt 206 lb (93.4 kg)   BMI 33.25 kg/m  No LMP recorded. Physical Exam Constitutional:      General: She is not in acute distress.    Appearance: Normal appearance. She is well-developed.    HENT:     Head: Normocephalic and atraumatic.  Eyes:     General: No scleral icterus.    Conjunctiva/sclera: Conjunctivae normal.  Neck:     Musculoskeletal: Normal range of motion and neck supple.  Cardiovascular:     Rate and Rhythm: Normal rate and regular rhythm.     Heart sounds: No murmur. No friction rub. No gallop.   Pulmonary:     Effort: Pulmonary effort is normal. No respiratory distress.     Breath sounds: Normal breath sounds. No wheezing or rales.  Abdominal:     General: Bowel sounds are normal. There is no distension.     Palpations: Abdomen is soft. There is no mass.     Tenderness: There is no abdominal tenderness. There is no guarding or rebound.  Musculoskeletal: Normal range of motion.     Lumbar back: She exhibits tenderness, bony tenderness and pain. She exhibits no swelling, no edema and no spasm.  Neurological:     General: No focal deficit present.     Mental Status: She is alert and oriented to person, place, and time.     Cranial Nerves: No cranial nerve deficit.  Skin:    General: Skin is warm and dry.     Findings: No erythema.  Psychiatric:        Mood and Affect: Mood  normal.        Behavior: Behavior normal.        Judgment: Judgment normal.     Female chaperone present for pelvic and breast  portions of the physical exam  Assessment: 33 y.o. G80P2002 female here for  1. Postpartum care and examination   2. Acute midline low back pain without sciatica  Plan: Problem List Items Addressed This Visit    None    Visit Diagnoses    Postpartum care and examination    -  Primary   Relevant Medications   cyclobenzaprine (FLEXERIL) 10 MG tablet   Other Relevant Orders   DG Pelvis 1-2 Views (Completed)   Ambulatory referral to Physical Therapy   Acute midline low back pain without sciatica       Relevant Medications   cyclobenzaprine (FLEXERIL) 10 MG tablet   Other Relevant Orders   DG Pelvis 1-2 Views (Completed)   Ambulatory referral to Physical Therapy     Pain appears to be either musculoskeletal or neurologic in origin.  Will obtain an x-ray of her pelvis to assess for fracture of her coccyx.  In either case, she will need physical therapy to recover.  Some of the components of her pain sound like they may have been going on prior to her delivery.  However, things are much worse since her delivery and she is having a hard time functioning.  I will also send in a muscle relaxer and she can take this, if needed.  We discussed the safety of this medication while breast-feeding.  There is very limited data in this scenario.  However the data that is available is reassuring.  It was suggested that she take 1 pill and if she get relief, then she continue taking it and monitor her infant for excessive drowsiness.  If she did not get relief, then there would be no need to take the medication.  She voiced understanding and agreement to this plan.    Prentice Docker, MD 07/10/2018 5:26 PM

## 2018-07-17 ENCOUNTER — Ambulatory Visit (INDEPENDENT_AMBULATORY_CARE_PROVIDER_SITE_OTHER): Payer: BC Managed Care – PPO | Admitting: Maternal Newborn

## 2018-07-17 ENCOUNTER — Other Ambulatory Visit: Payer: Self-pay

## 2018-07-17 ENCOUNTER — Telehealth: Payer: Self-pay

## 2018-07-17 ENCOUNTER — Encounter: Payer: Self-pay | Admitting: Maternal Newborn

## 2018-07-17 VITALS — BP 100/60 | Ht 66.0 in | Wt 202.0 lb

## 2018-07-17 DIAGNOSIS — N61 Mastitis without abscess: Secondary | ICD-10-CM

## 2018-07-17 MED ORDER — CEPHALEXIN 500 MG PO CAPS: 500.0000 mg | ORAL_CAPSULE | Freq: Four times a day (QID) | ORAL | 0 refills | Status: AC

## 2018-07-17 NOTE — Telephone Encounter (Signed)
Pt calling; is breastfeeding; has a lot of pain in right breast; woke up this am c flu-like sxs and breast is very sore.  [phone]  Adv needs to be seen; tx'd to [facility] for scheduling.

## 2018-07-17 NOTE — Patient Instructions (Signed)
Mastitis  Mastitis is inflammation of the breast tissue. It occurs most often in women who are breastfeeding, but it can also affect other women, and sometimes even men. What are the causes? This condition is usually caused by a bacterial infection. Bacteria enter the breast tissue through cuts or openings in the skin. Typically, this occurs with breastfeeding because of cracked or irritated nipples. Sometimes, it can occur when there is no opening in the skin. This is usually caused by plugged milk ducts. Other causes include:  Nipple piercing.  Some forms of breast cancer. What are the signs or symptoms? Symptoms of this condition include:  Swelling, redness, tenderness, and pain in an area of the breast. The area may also feel warm to the touch. These symptoms usually affect the upper part of the breast, toward the armpit region.  Swelling of the glands under the arm on the same side.  Fever.  Rapid pulse.  Fatigue, headache, and flu-like muscle aches. If an infection is allowed to progress, a collection of pus (abscess) may develop. How is this diagnosed? This condition can usually be diagnosed based on a physical exam and your symptoms. You may also have other tests, such as:  Blood tests to determine if your body is fighting a bacterial infection.  Mammogram or ultrasound tests to rule out other problems or diseases.  Testing of pus and other fluids. Pus from the breast may be collected and examined in the lab. If an abscess has developed, the fluid in the abscess can be removed with a needle. This test can be used to confirm the diagnosis and identify the bacteria present.  If you are breastfeeding, breast milk may be cultured and tested for bacteria. How is this treated? Treatment for this condition may include:  Applying heat or cold compresses to the affected area.  Medicine for pain.  Antibiotic medicine to treat a bacterial infection. This is usually taken by  mouth.  Self-care such as rest and increased fluid intake.  If an abscess has developed, it may be treated by removing fluid with a needle. Mastitis that occurs with breastfeeding will sometimes go away on its own, so your health care provider may choose to wait 24 hours after first seeing you to decide whether a prescription medicine is needed. You may be told of different ways to help manage breastfeeding, such as continuing to breastfeed or pump in order to ensure adequate milk flow. Follow these instructions at home: Medicines  Take over-the-counter and prescription medicines only as told by your health care provider.  If you were prescribed an antibiotic medicine, take it as told by your health care provider. Do not stop taking the antibiotic even if you start to feel better. General instructions  Do not wear a tight or underwire bra. Wear a soft, supportive bra.  Increase your fluid intake, especially if you have a fever.  Get plenty of rest. If you are breastfeeding:  Continue to empty your breasts as often as possible either by breastfeeding or by using a breast pump. This will decrease the pressure and the pain that comes with it. Ask your health care provider if changes need to be made to your breastfeeding or pumping routine.  Keep your nipples clean and dry.  During breastfeeding, empty the first breast completely before going to the other breast. If your baby is not emptying your breasts completely, use a breast pump to empty your breasts.  Use breast massage during feeding or pumping sessions.    If directed, apply moist heat to the affected area of your breast right before breastfeeding or pumping. Use the heat source that your health care provider recommends.  If directed, put ice on the affected area of your breast right after breastfeeding or pumping: ? Put ice in a plastic bag. ? Place a towel between your skin and the bag. ? Leave the ice on for 20 minutes.  If  you go back to work, pump your breasts while at work to stay within your nursing schedule.  Avoid allowing your breasts to become overly filled with milk (engorged). Contact a health care provider if:  You have pus-like discharge from the breast.  You have a fever.  Your symptoms do not improve within 2 days of starting treatment. Get help right away if:  Your pain and swelling are getting worse.  You have pain that is not controlled with medicine.  You have a red line extending from the breast toward your armpit. Summary  Mastitis is inflammation of the breast tissue. It occurs most often in women who are breastfeeding, but it can also affect non-breastfeeding women and some men.  This condition is usually caused by a bacterial infection.  This condition may be treated with hot and cold compresses, medicines, self-care, and certain breastfeeding strategies.  If you were prescribed an antibiotic medicine, take it as told by your health care provider. Do not stop taking the antibiotic even if you start to feel better. This information is not intended to replace advice given to you by your health care provider. Make sure you discuss any questions you have with your health care provider. Document Released: 12/26/2004 Document Revised: 12/08/2016 Document Reviewed: 01/18/2016 Elsevier Patient Education  2020 [employer]  

## 2018-07-17 NOTE — Progress Notes (Signed)
Obstetrics & Gynecology Office Visit   Chief Complaint:  Chief Complaint  Patient presents with  . Mastitis    highest temp 99.6, started yesterday with small lump on right breast, now entire right breast is tender and red    History of Present Illness: Annette Lawson began feeling ill yesterday with flu like symptoms, fatigue, mild temperature elevation, and a small lump in her right breast. Today, the right breast has become swollen, red, feels warm, and is very tender, with the majority of the breast tissue affected. The left breast has no symptoms. She has continued to pump, but it is difficult to empty on the affected side because of the pain.  Review of Systems: Review of systems negative unless otherwise noted in HPI  Past Medical History:  Past Medical History:  Diagnosis Date  . ADD (attention deficit disorder)   . Cholestasis during pregnancy   . Depression   . Vaginal Pap smear, abnormal     Past Surgical History:  Past Surgical History:  Procedure Laterality Date  . WISDOM TOOTH EXTRACTION      Gynecologic History: No LMP recorded.  Obstetric History: [id number]  Family History:  Family History  Problem Relation Age of Onset  . Hypothyroidism Mother   . COPD Maternal Grandmother        reactive airway disease    Social History:  Social History   Socioeconomic History  . Marital status: Married    Spouse name: Not on file  . Number of children: Not on file  . Years of education: Not on file  . Highest education level: Not on file  Occupational History  . Not on file  Social Needs  . Financial resource strain: Not on file  . Food insecurity    Worry: Not on file    Inability: Not on file  . Transportation needs    Medical: Not on file    Non-medical: Not on file  Tobacco Use  . Smoking status: Current Some Day Smoker  . Smokeless tobacco: Never Used  Substance and Sexual Activity  . Alcohol use: Yes    Alcohol/week: 0.0 standard drinks    Comment:  Occasional  . Drug use: No  . Sexual activity: Yes    Birth control/protection: None  Lifestyle  . Physical activity    Days per week: Not on file    Minutes per session: Not on file  . Stress: Not on file  Relationships  . Social Herbalist on phone: Not on file    Gets together: Not on file    Attends religious service: Not on file    Active member of club or organization: Not on file    Attends meetings of clubs or organizations: Not on file    Relationship status: Not on file  . Intimate partner violence    Fear of current or ex partner: Not on file    Emotionally abused: Not on file    Physically abused: Not on file    Forced sexual activity: Not on file  Other Topics Concern  . Not on file  Social History Narrative  . Not on file    Allergies:  No Known Allergies  Medications: Prior to Admission medications   Medication Sig Start Date End Date Taking? Authorizing Provider  acetaminophen (TYLENOL) 500 MG tablet Take 500 mg by mouth every 6 (six) hours as needed for mild pain or headache.   Yes [provider]  amphetamine-dextroamphetamine (  ADDERALL) 10 MG tablet Take 10 mg by mouth daily with breakfast. Twice a day.   Yes [provider]  ibuprofen (ADVIL) 600 MG tablet Take 1 tablet (600 mg total) by mouth every 6 (six) hours as needed for moderate pain. 06/30/18  Yes Rod Can, CNM  Prenatal Vit-Fe Fumarate-FA (PRENATAL MULTIVITAMIN) TABS tablet Take 1 tablet by mouth daily at 12 noon. 12/04/15  Yes Juliene Pina, CNM  cyclobenzaprine (FLEXERIL) 10 MG tablet Take 1 tablet (10 mg total) by mouth every 8 (eight) hours as needed for muscle spasms. Patient not taking: Reported on 07/17/2018 07/10/18   Will Bonnet, MD  diphenhydrAMINE (BENADRYL) 25 MG tablet Take 25 mg by mouth every 6 (six) hours as needed.    [provider]  hydrOXYzine (ATARAX/VISTARIL) 25 MG tablet Take 1 tablet (25 mg total) by mouth every 6 (six) hours  as needed for itching. Patient not taking: Reported on 07/17/2018 06/04/18   Rexene Agent, CNM  oxyCODONE-acetaminophen (PERCOCET) 5-325 MG tablet Take 1 tablet by mouth every 8 (eight) hours as needed for severe pain. Patient not taking: Reported on 07/17/2018 07/04/18   Will Bonnet, MD    Physical Exam Vitals:  Vitals:   07/17/18 1630  BP: 100/60   No LMP recorded.  Physical Exam Constitutional:      General: She is not in acute distress. Pulmonary:     Effort: Pulmonary effort is normal.  Chest:     Breasts:        Right: Swelling, skin change and tenderness present.        Left: Normal. No skin change or tenderness.    Neurological:     Mental Status: She is oriented to person, place, and time.  Skin:    General: Skin is warm and dry.  Psychiatric:        Mood and Affect: Mood normal.        Behavior: Behavior normal.    Assessment: 33 y.o. [id number] with lactational mastitis.  Plan: Problem List Items Addressed This Visit    None    Visit Diagnoses    Mastitis, right, acute    -  Primary   Relevant Medications   cephALEXin (KEFLEX) 500 MG capsule     1) Begin Keflex 500 mg QID and continue for 10 days. 2) Complete emptying of both breasts through feeding and breast pump. 3) Ibuprofen and cold compresses for pain relief. 4) Increase hydration and rest as much as possible.  [name], CNM 07/17/2018  4:47 PM

## 2018-07-18 ENCOUNTER — Ambulatory Visit: Payer: BC Managed Care – PPO | Admitting: Physical Therapy

## 2018-07-24 ENCOUNTER — Other Ambulatory Visit: Payer: Self-pay

## 2018-07-24 ENCOUNTER — Encounter: Payer: Self-pay | Admitting: Physical Therapy

## 2018-07-24 ENCOUNTER — Ambulatory Visit: Payer: BC Managed Care – PPO | Admitting: Physical Therapy

## 2018-07-24 ENCOUNTER — Ambulatory Visit: Payer: BC Managed Care – PPO | Attending: Obstetrics and Gynecology | Admitting: Physical Therapy

## 2018-07-24 ENCOUNTER — Other Ambulatory Visit: Payer: Self-pay | Admitting: Obstetrics and Gynecology

## 2018-07-24 DIAGNOSIS — M545 Low back pain, unspecified: Secondary | ICD-10-CM

## 2018-07-24 DIAGNOSIS — R278 Other lack of coordination: Secondary | ICD-10-CM | POA: Insufficient documentation

## 2018-07-24 DIAGNOSIS — R102 Pelvic and perineal pain: Secondary | ICD-10-CM

## 2018-07-24 DIAGNOSIS — M6281 Muscle weakness (generalized): Secondary | ICD-10-CM | POA: Diagnosis not present

## 2018-07-24 DIAGNOSIS — R293 Abnormal posture: Secondary | ICD-10-CM | POA: Insufficient documentation

## 2018-07-24 MED ORDER — OXYCODONE-ACETAMINOPHEN 5-325 MG PO TABS: 1.0000 | ORAL_TABLET | Freq: Three times a day (TID) | ORAL | 0 refills | Status: DC | PRN

## 2018-07-24 NOTE — Telephone Encounter (Signed)
advise

## 2018-07-24 NOTE — Telephone Encounter (Signed)
Response by another means

## 2018-07-24 NOTE — Telephone Encounter (Signed)
Sent rx in for patient. She is aware.

## 2018-07-24 NOTE — Therapy (Signed)
[facility] [address]. [place], [place], [address] Phone: [phone]   Fax:  [phone]  Physical Therapy Evaluation  Patient Details  Name: Annette Lawson MRN: [id number] Date of Birth: [birth year] Referring Provider (PT): Prentice Docker, MD   Encounter Date: 07/24/2018  PT End of Session - 07/24/18 1621    Visit Number  1    Number of Visits  17    Date for PT Re-Evaluation  09/18/18    PT Start Time  1400    PT Stop Time  1502    PT Time Calculation (min)  62 min    Activity Tolerance  Patient limited by pain    Behavior During Therapy  [facility] for tasks assessed/performed;Restless       Past Medical History:  Diagnosis Date  . ADD (attention deficit disorder)   . Cholestasis during pregnancy   . Depression   . Vaginal Pap smear, abnormal     Past Surgical History:  Procedure Laterality Date  . WISDOM TOOTH EXTRACTION      There were no vitals filed for this visit.       [facility] PT Assessment - 07/24/18 0001      Assessment   Medical Diagnosis  Post-partum Care/ LBP    Referring Provider (PT)  Prentice Docker, MD    Onset Date/Surgical Date  03/24/18    Hand Dominance  Right    Next MD Visit  Aug 2020      Precautions   Precautions  None      Balance Screen   Has the patient fallen in the past 6 months  No      Prior Function   Level of Independence  Independent      SUBJECTIVE Chief complaint:  Patient notes that she had stabbing pain at the pubic symphysis during pregnancy. Back labor for 8 hours; now presents with sacral pain. Pain gets worse with a lot of walking, moving around, sitting on hard things. Patient notes some mild stiffness in the AM, but most pain comes on at the end of the day. Patient notes sleep is improving; patient is a side sleeper. Patient notes increased pain getting in and out of car. Patient states goals for therapy are: more ease moving around, decreased pain, and lack of  "warped" feeling. Patient enjoys outdoor sports: hiking, hunting, fishing.  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  Recent memory is intact.  Remote memory is intact.  Attention span and concentration are intact.  Expressive speech is intact.  Patient's fund of knowledge is within normal limits for educational level.  SENSATION: Grossly intact to light touch bilateral LEs as determined by testing dermatomes L2-S2 Proprioception and hot/cold testing deferred on this date   MUSCULOSKELETAL: Tremor: None Bulk: Normal Tone: Normal  Posture Slumped, restless, kyphotic, lordotic. Genu valgum, B.   Gait Antalgic and stiff; decreased lumbar and pelvic movement in all phases   Palpation L pubic tubercle  inferior and posterior shear; R PSIS and sacral border TTP  Strength (out of 5) R/L 5/5 Hip flexion 5/5 Hip ER 5/5 Hip IR 5/5 Hip abduction 5/5 Hip adduction 3/3 Hip extension 5/5 Knee extension 5/5 Knee flexion 5/5 Ankle dorsiflexion *Indicates pain   AROM (degrees) R/L (all movements include overpressure unless otherwise stated) Lumbar forward flexion (0-65): WFL Lumbar extension (0-30): WFL Lumbar lateral flexion (0-25): WFL* B Lumbar rotation:  Difficulty dissociating from hips, WFL Hip IR (0-45): B: WFL, pain on L Hip ER (0-45): B: WFL, pain on L Hip Flexion (0-125): WNL Hip Abduction (0-40): WFL* B Hip extension (0-15): R: 0*  L: 0 *Indicates pain  PROM (degrees) PROM =  AROM R/L (all movements include overpressure unless otherwise stated) Lumbar forward flexion (65 degrees): WFL Lumbar extension (30 degrees): WFL Lumbar lateral flexion (25 degrees): B: WFL* (contralateral pain produced) *Indicates pain  Repeated Movements No centralization or peripheralization of symptoms with repeated lumbar extension or flexion.    Muscle Length [place]: painful and limited. Patient not able to extend past 0 degrees hip extension    Passive Accessory Intervertebral Motion (PAIVM) Pt has reproduction of back pain with CPA L4/L5. Generally hypomobile throughout. Mild step off noted at L3/4.    SPECIAL TESTS Lumbar quadrant: R: Negative L: Negative FABER: R: Negative L: Negative FADIR: R: Negative L: Positive  Hip scour: R: Negative L: Negative SLS: R: Negative L: Positive Diastasis Recti: 3 finger width separation throughout  ASSESSMENT Patient is a 33 year old presenting to clinic with chief complaints of sacral pain and pubic symphysis pain. Upon examination, patient demonstrates deficits in posture, pain, pubic alignment, pain free spinal and hip ROM, and coordination as evidenced by inability to move joints independent of compensatory patterns, decreased IR on L hip, DRAM of 3 finger width, and L pubic tubercle inferior and posterior shear. Patient's responses on FOTO outcome measures (65) indicate moderate functional limitations/disability/distress. Patient's progress may be limited due to physical demands of care giving; however, patient's acuity is advantageous. Patient responded positively to educational and pain modulation interventions. Patient will benefit from continued skilled therapeutic intervention to address deficits in posture, pain, pubic alignment, pain free spinal and hip ROM, and coordination in order to increase function and improve overall QOL.  TREATMENT  Manual Therapy: Sacral mobilizations, B, multiple bouts for decreased muscle spasm STM to  sacral border for decreased muscle spasm and stiffness at joint  Neuromuscular Re-education: Diaphragmatic breathing for pain modulation Gentle pelvic tilts in sitting x10 Patient educated on and demonstrated posture and body mechanics for transfers in/out of bed, car, and low seats.    Objective measurements completed on examination: See above findings.    PT Long Term Goals - 07/24/18 1640      PT LONG TERM GOAL #1   Title  Pt will increase FOTO (mODI) to at least 79 points in order demonstrate clinically significant reduction in back pain/disability.    Baseline  IE: 65    Time  8    Period  Weeks    Status  New    Target Date  09/18/18      PT LONG TERM GOAL #2   Title  Pt will decrease worst pain as reported on NPRS by at least 2 points in order to demonstrate clinically significant reduction in back pain.    Baseline  IE: 4/10    Time  8    Period  Weeks    Status  New  Target Date  09/18/18      PT LONG TERM GOAL #3   Title  Patient will demonstrate improved lumbar and hip ROM in isolation from pelvic complex and in absence of compensatory patterns and pain to indicate adequate ROM and function for return to participation in wellness activities.    Baseline  IE: unable and painful    Time  8    Period  Weeks    Status  New    Target Date  09/18/18      PT LONG TERM GOAL #4   Title  Patient will demonstrate maintained and level pubic symphysis alignment for 2 weeks with independent adherence to HEP in order to transition to self-management safely and effectively.    Baseline  IE: L pubic tubercle inferior and posterior shear    Time  8    Period  Weeks    Target Date  09/18/18             Plan - 07/24/18 1622    Clinical Impression Statement  Patient is a 33 year old presenting to clinic with chief complaints of sacral pain and pubic symphysis pain. Upon examination, patient demonstrates deficits in posture, pain, pubic alignment, pain free spinal and hip  ROM, and coordination as evidenced by inability to move joints independent of compensatory patterns, decreased IR on L hip, DRAM of 3 finger width, and L pubic tubercle inferior and posterior shear. Patient's responses on FOTO outcome measures (65) indicate moderate functional limitations/disability/distress. Patient's progress may be limited due to physical demands of care giving; however, patient's acuity is advantageous. Patient responded positively to educational and pain modulation interventions. Patient will benefit from continued skilled therapeutic intervention to address deficits in posture, pain, pubic alignment, pain free spinal and hip ROM, and coordination in order to increase function and improve overall QOL.    Personal Factors and Comorbidities  Education;Sex;Past/Current Experience;Time since onset of injury/illness/exacerbation;Fitness;Profession;Behavior Pattern    Examination-Activity Limitations  Squat;Stairs;Locomotion Level;Stand;Caring for Others;Sit;Transfers;Sleep;Bed Mobility    Examination-Participation Restrictions  Driving;Meal Prep;Yard Work;Cleaning;Interpersonal Relationship;Laundry;Shop    Stability/Clinical Decision Making  Evolving/Moderate complexity    Clinical Decision Making  Moderate    Rehab Potential  Good    PT Frequency  2x / week    PT Duration  8 weeks    PT Treatment/Interventions  ADLs/Self Care Home Management;Aquatic Therapy;Cryotherapy;Moist Heat;Stair training;Gait training;Therapeutic exercise;Balance training;Patient/family education;Therapeutic activities;Functional mobility training;Neuromuscular re-education;Manual techniques;Passive range of motion;Dry needling;Scar mobilization;Spinal Manipulations;Joint Manipulations;Compression bandaging    PT Next Visit Plan  Pubic Symphysis MET and quick stretch    PT Home Exercise Plan  Gentle pelvic tilts and knees to chest stretch    Consulted and Agree with Plan of Care  Patient       Patient will  benefit from skilled therapeutic intervention in order to improve the following deficits and impairments:  Abnormal gait, Decreased endurance, Decreased mobility, Difficulty walking, Increased muscle spasms, Pain, Postural dysfunction, Decreased strength, Decreased coordination, Decreased activity tolerance, Improper body mechanics, Decreased range of motion  Visit Diagnosis: 1. Low back pain, unspecified back pain laterality, unspecified chronicity, unspecified whether sciatica present   2. Abnormal posture   3. Other lack of coordination        Problem List Patient Active Problem List   Diagnosis Date Noted  . Normal labor 06/28/2018  . GBS (group B Streptococcus carrier), +RV culture, currently pregnant 06/17/2018  . Back pain affecting pregnancy 06/11/2018  . Supervision of high risk pregnancy, antepartum 11/20/2017  .  Rh negative state in antepartum period 11/20/2017  . History of cholestasis during pregnancy 11/20/2017  . Depression affecting pregnancy 11/20/2017  . History of postpartum hemorrhage, currently pregnant 11/20/2017  . Multinodular goiter 07/02/2017  . Vitamin D deficiency 02/07/2017  . Postpartum care following vaginal delivery (11/22) 12/01/2015  . ADD (attention deficit disorder) 02/22/2015   Myles Gip PT, DPT [phone] 07/24/2018, 4:55 PM  [facility] [address] [place], [place], [address] Phone: [phone]   Fax:  [phone]  Name: Annette Lawson MRN: [id number] Date of Birth: [birth year]

## 2018-07-30 ENCOUNTER — Other Ambulatory Visit: Payer: Self-pay

## 2018-07-30 ENCOUNTER — Encounter: Payer: Self-pay | Admitting: Physical Therapy

## 2018-07-30 ENCOUNTER — Ambulatory Visit: Payer: BC Managed Care – PPO | Admitting: Physical Therapy

## 2018-07-30 DIAGNOSIS — M545 Low back pain, unspecified: Secondary | ICD-10-CM

## 2018-07-30 DIAGNOSIS — R293 Abnormal posture: Secondary | ICD-10-CM

## 2018-07-30 DIAGNOSIS — R278 Other lack of coordination: Secondary | ICD-10-CM

## 2018-07-30 NOTE — Therapy (Signed)
[facility] [address] [place], [place], [address] Phone: [phone]   Fax:  [phone]  Physical Therapy Treatment  Patient Details  Name: Annette Lawson MRN: [id number] Date of Birth: 06/01/1985 Referring Provider (PT): Prentice Docker, MD   Encounter Date: 07/30/2018  PT End of Session - 07/30/18 1728    Visit Number  2    Number of Visits  17    Date for PT Re-Evaluation  09/18/18    PT Start Time  1350    PT Stop Time  1448    PT Time Calculation (min)  58 min    Activity Tolerance  Patient limited by pain    Behavior During Therapy  [facility] for tasks assessed/performed;Restless       Past Medical History:  Diagnosis Date  . ADD (attention deficit disorder)   . Cholestasis during pregnancy   . Depression   . Vaginal Pap smear, abnormal     Past Surgical History:  Procedure Laterality Date  . WISDOM TOOTH EXTRACTION      There were no vitals filed for this visit.  Subjective Assessment - 07/30/18 1727    Subjective  Patient reports decreased irritability 2/2 to pain medication prior to session. Continues to feel "warped" at the pelvis.      TREATMENT  Pre-treatment assessment: L ASIS superior and posterior; L pubic tubercle superior and posterior  Manual Therapy: Gentle pubic symphysis mobilizations Sacral border mobilizations, B Sacrotuberous ligament STM, L  R inominate superior/posterior mobilizations L inominate inferior/anterior mobilizations  Neuromuscular Re-education: MET SI correction with dowel MET SI correction in multiple positions (hooklying, 90/90, seated) "Shotgun" technique to encourage alignment of pubic symphysis Hip adduction isometrics 5 sec hold x5 SI joint belt donning/doffing education.   Treatments unbilled: MHP to lumbar/sacral region in supine positions  Post-treatment assessment: Pubic tubercles in closer alignment, L ASIS continues to be superior and  posterior  Patient educated throughout session on appropriate technique and form using multi-modal cueing, HEP, and activity modification. Patient articulated understanding and returned demonstration.  Patient Response to interventions: Patient reports relief from mobilizations and sacral belt.  ASSESSMENT Patient presents to clinic with excellent motivation to participate in therapy. Patient demonstrates deficits in posture, pain, pubic alignment, pain free spinal and hip ROM, and coordination as evidenced by inability to move joints independent of compensatory patterns, decreased IR on L hip, DRAM of 3 finger width, and L pubic tubercle superior and posterior shear. Patient able to achieve decreased spasm in musculature attaching to sacrum B during today's session and responded positively to educational interventions. Patient will benefit from continued skilled therapeutic intervention to address remaining deficits in posture, pain, pubic alignment, pain free spinal and hip ROM, and coordination in order to increase function, and improve overall QOL.       PT Long Term Goals - 07/24/18 1640      PT LONG TERM GOAL #1   Title  Pt will increase FOTO (mODI) to at least 79 points in order demonstrate clinically significant reduction in back pain/disability.    Baseline  IE: 65    Time  8    Period  Weeks    Status  New    Target Date  09/18/18      PT LONG TERM GOAL #2   Title  Pt will decrease worst pain as reported on NPRS by at least 2 points in order to demonstrate clinically significant reduction in back pain.    Baseline  IE: 4/10    Time  8    Period  Weeks    Status  New    Target Date  09/18/18      PT LONG TERM GOAL #3   Title  Patient will demonstrate improved lumbar and hip ROM in isolation from pelvic complex and in absence of compensatory patterns and pain to indicate adequate ROM and function for return to participation in wellness activities.    Baseline  IE: unable  and painful    Time  8    Period  Weeks    Status  New    Target Date  09/18/18      PT LONG TERM GOAL #4   Title  Patient will demonstrate maintained and level pubic symphysis alignment for 2 weeks with independent adherence to HEP in order to transition to self-management safely and effectively.    Baseline  IE: L pubic tubercle inferior and posterior shear    Time  8    Period  Weeks    Target Date  09/18/18            Plan - 07/30/18 1729    Clinical Impression Statement  Patient presents to clinic with excellent motivation to participate in therapy. Patient demonstrates deficits in posture, pain, pubic alignment, pain free spinal and hip ROM, and coordination as evidenced by inability to move joints independent of compensatory patterns, decreased IR on L hip, DRAM of 3 finger width, and L pubic tubercle superior and posterior shear. Patient able to achieve decreased spasm in musculature attaching to sacrum B during today's session and responded positively to educational interventions. Patient will benefit from continued skilled therapeutic intervention to address remaining deficits in posture, pain, pubic alignment, pain free spinal and hip ROM, and coordination in order to increase function, and improve overall QOL.    Personal Factors and Comorbidities  Education;Sex;Past/Current Experience;Time since onset of injury/illness/exacerbation;Fitness;Profession;Behavior Pattern    Examination-Activity Limitations  Squat;Stairs;Locomotion Level;Stand;Caring for Others;Sit;Transfers;Sleep;Bed Mobility    Examination-Participation Restrictions  Driving;Meal Prep;Yard Work;Cleaning;Interpersonal Relationship;Laundry;Shop    Stability/Clinical Decision Making  Evolving/Moderate complexity    Rehab Potential  Good    PT Frequency  2x / week    PT Duration  8 weeks    PT Treatment/Interventions  ADLs/Self Care Home Management;Aquatic Therapy;Cryotherapy;Moist Heat;Stair training;Gait  training;Therapeutic exercise;Balance training;Patient/family education;Therapeutic activities;Functional mobility training;Neuromuscular re-education;Manual techniques;Passive range of motion;Dry needling;Scar mobilization;Spinal Manipulations;Joint Manipulations;Compression bandaging    PT Next Visit Plan  Pubic Symphysis MET and quick stretch    PT Home Exercise Plan  Gentle pelvic tilts and knees to chest stretch    Consulted and Agree with Plan of Care  Patient       Patient will benefit from skilled therapeutic intervention in order to improve the following deficits and impairments:  Abnormal gait, Decreased endurance, Decreased mobility, Difficulty walking, Increased muscle spasms, Pain, Postural dysfunction, Decreased strength, Decreased coordination, Decreased activity tolerance, Improper body mechanics, Decreased range of motion  Visit Diagnosis: 1. Low back pain, unspecified back pain laterality, unspecified chronicity, unspecified whether sciatica present   2. Abnormal posture   3. Other lack of coordination        Problem List Patient Active Problem List   Diagnosis Date Noted  . Normal labor 06/28/2018  . GBS (group B Streptococcus carrier), +RV culture, currently pregnant 06/17/2018  . Back pain affecting pregnancy 06/11/2018  . Supervision of high risk pregnancy, antepartum 11/20/2017  . Rh negative state in antepartum period 11/20/2017  . History  of cholestasis during pregnancy 11/20/2017  . Depression affecting pregnancy 11/20/2017  . History of postpartum hemorrhage, currently pregnant 11/20/2017  . Multinodular goiter 07/02/2017  . Vitamin D deficiency 02/07/2017  . Postpartum care following vaginal delivery (11/22) 12/01/2015  . ADD (attention deficit disorder) 02/22/2015   Myles Gip PT, DPT [phone] 07/30/2018, 5:40 PM  [place] [facility] [address] [place], [place], [address] Phone: [phone]   Fax:   [phone]  Name: Annette Lawson MRN: [id number] Date of Birth: 1985-10-16

## 2018-08-01 ENCOUNTER — Other Ambulatory Visit: Payer: Self-pay

## 2018-08-01 ENCOUNTER — Ambulatory Visit: Payer: BC Managed Care – PPO | Admitting: Physical Therapy

## 2018-08-01 DIAGNOSIS — R278 Other lack of coordination: Secondary | ICD-10-CM

## 2018-08-01 DIAGNOSIS — M545 Low back pain, unspecified: Secondary | ICD-10-CM

## 2018-08-01 DIAGNOSIS — R293 Abnormal posture: Secondary | ICD-10-CM

## 2018-08-01 NOTE — Therapy (Signed)
[facility] [address] [place], [place], [address] Phone: [phone]   Fax:  [phone]  Physical Therapy Treatment  Patient Details  Name: Annette Lawson MRN: [id number] Date of Birth: 1985-01-27 Referring Provider (PT): Prentice Docker, MD   Encounter Date: 08/01/2018  PT End of Session - 08/01/18 7915    Visit Number  3    Number of Visits  17    Date for PT Re-Evaluation  09/18/18    PT Start Time  0569    PT Stop Time  1445    PT Time Calculation (min)  40 min    Activity Tolerance  Patient limited by pain    Behavior During Therapy  [facility] for tasks assessed/performed;Restless       Past Medical History:  Diagnosis Date  . ADD (attention deficit disorder)   . Cholestasis during pregnancy   . Depression   . Vaginal Pap smear, abnormal     Past Surgical History:  Procedure Laterality Date  . WISDOM TOOTH EXTRACTION      There were no vitals filed for this visit.  Subjective Assessment - 08/01/18 1813    Subjective  Patient reports some increased soreness after last visit, but is not sure the source (PT/activity at home). Patient notes that she has been busy and not able to do the exercises as much as she would have liked.      TREATMENT  Pre-treatment assessment: L ASIS superior and posterior; L pubic tubercle superior and posterior  Manual Therapy: Gentle pubic symphysis mobilizations Sacral border mobilizations, B Sacrotuberous ligament STM, L  R inominate superior/posterior mobilizations with movement  L inominate inferior/anterior mobilizations with movement  Neuromuscular Re-education: MET SI correction in sidelying using L hip extension with anterior tilt of pelvis MET SI correction in sidelying use R hip flexion with posterior tilt of pelvis Education on how to set up self-mobilizations at home B QL stretch in sidelying with R hip flexion, L hip extension  Treatments unbilled: MHP to  lumbar/sacral region in sidelying positions  Post-treatment assessment: Pubic tubercles in closer alignment, L ASIS continues to be marginally superior and posterior  Patient educated throughout session on appropriate technique and form using multi-modal cueing, HEP, and activity modification. Patient articulated understanding and returned demonstration.  Patient Response to interventions: Patient reports relief from mobilizations  ASSESSMENT Patient presents to clinic with excellent motivation to participate in therapy. Patient demonstrates deficits in posture, pain, pubic alignment, pain free spinal and hip ROM, and coordination as evidenced by inability to move joints independent of compensatory patterns, decreased IR on L hip, DRAM of 3 finger width, and L pubic tubercle superior and posterior shear. Patient able to achieve decreased spasm in musculature attaching to sacrum B during today's session and responded positively to manual interventions. Patient will benefit from continued skilled therapeutic intervention to address remaining deficits in posture, pain, pubic alignment, pain free spinal and hip ROM, and coordination in order to increase function, and improve overall QOL.    PT Long Term Goals - 07/24/18 1640      PT LONG TERM GOAL #1   Title  Pt will increase FOTO (mODI) to at least 79 points in order demonstrate clinically significant reduction in back pain/disability.    Baseline  IE: 65    Time  8    Period  Weeks    Status  New    Target Date  09/18/18      PT LONG TERM  GOAL #2   Title  Pt will decrease worst pain as reported on NPRS by at least 2 points in order to demonstrate clinically significant reduction in back pain.    Baseline  IE: 4/10    Time  8    Period  Weeks    Status  New    Target Date  09/18/18      PT LONG TERM GOAL #3   Title  Patient will demonstrate improved lumbar and hip ROM in isolation from pelvic complex and in absence of compensatory  patterns and pain to indicate adequate ROM and function for return to participation in wellness activities.    Baseline  IE: unable and painful    Time  8    Period  Weeks    Status  New    Target Date  09/18/18      PT LONG TERM GOAL #4   Title  Patient will demonstrate maintained and level pubic symphysis alignment for 2 weeks with independent adherence to HEP in order to transition to self-management safely and effectively.    Baseline  IE: L pubic tubercle inferior and posterior shear    Time  8    Period  Weeks    Target Date  09/18/18            Plan - 08/01/18 1815    Clinical Impression Statement  Patient presents to clinic with excellent motivation to participate in therapy. Patient demonstrates deficits in posture, pain, pubic alignment, pain free spinal and hip ROM, and coordination as evidenced by inability to move joints independent of compensatory patterns, decreased IR on L hip, DRAM of 3 finger width, and L pubic tubercle superior and posterior shear. Patient able to achieve decreased spasm in musculature attaching to sacrum B during today's session and responded positively to manual interventions. Patient will benefit from continued skilled therapeutic intervention to address remaining deficits in posture, pain, pubic alignment, pain free spinal and hip ROM, and coordination in order to increase function, and improve overall QOL.    Personal Factors and Comorbidities  Education;Sex;Past/Current Experience;Time since onset of injury/illness/exacerbation;Fitness;Profession;Behavior Pattern    Examination-Activity Limitations  Squat;Stairs;Locomotion Level;Stand;Caring for Others;Sit;Transfers;Sleep;Bed Mobility    Examination-Participation Restrictions  Driving;Meal Prep;Yard Work;Cleaning;Interpersonal Relationship;Laundry;Shop    Stability/Clinical Decision Making  Evolving/Moderate complexity    Rehab Potential  Good    PT Frequency  2x / week    PT Duration  8 weeks     PT Treatment/Interventions  ADLs/Self Care Home Management;Aquatic Therapy;Cryotherapy;Moist Heat;Stair training;Gait training;Therapeutic exercise;Balance training;Patient/family education;Therapeutic activities;Functional mobility training;Neuromuscular re-education;Manual techniques;Passive range of motion;Dry needling;Scar mobilization;Spinal Manipulations;Joint Manipulations;Compression bandaging    PT Next Visit Plan  Pubic Symphysis MET and quick stretch    PT Home Exercise Plan  Gentle pelvic tilts and knees to chest stretch    Consulted and Agree with Plan of Care  Patient       Patient will benefit from skilled therapeutic intervention in order to improve the following deficits and impairments:  Abnormal gait, Decreased endurance, Decreased mobility, Difficulty walking, Increased muscle spasms, Pain, Postural dysfunction, Decreased strength, Decreased coordination, Decreased activity tolerance, Improper body mechanics, Decreased range of motion  Visit Diagnosis: 1. Low back pain, unspecified back pain laterality, unspecified chronicity, unspecified whether sciatica present   2. Abnormal posture   3. Other lack of coordination        Problem List Patient Active Problem List   Diagnosis Date Noted  . Normal labor 06/28/2018  . GBS (group B  Streptococcus carrier), +RV culture, currently pregnant 06/17/2018  . Back pain affecting pregnancy 06/11/2018  . Supervision of high risk pregnancy, antepartum 11/20/2017  . Rh negative state in antepartum period 11/20/2017  . History of cholestasis during pregnancy 11/20/2017  . Depression affecting pregnancy 11/20/2017  . History of postpartum hemorrhage, currently pregnant 11/20/2017  . Multinodular goiter 07/02/2017  . Vitamin D deficiency 02/07/2017  . Postpartum care following vaginal delivery (11/22) 12/01/2015  . ADD (attention deficit disorder) 02/22/2015   Myles Gip PT, DPT [phone] 08/01/2018, 6:19 PM  [facility] [address] [place], [place], [address] Phone: [phone]   Fax:  [phone]  Name: Annette Lawson MRN: [id number] Date of Birth: 04/04/1985

## 2018-08-06 ENCOUNTER — Ambulatory Visit: Payer: BC Managed Care – PPO | Admitting: Physical Therapy

## 2018-08-06 ENCOUNTER — Other Ambulatory Visit: Payer: Self-pay

## 2018-08-06 ENCOUNTER — Encounter: Payer: Self-pay | Admitting: Physical Therapy

## 2018-08-06 DIAGNOSIS — M545 Low back pain, unspecified: Secondary | ICD-10-CM

## 2018-08-06 DIAGNOSIS — R102 Pelvic and perineal pain: Secondary | ICD-10-CM

## 2018-08-06 DIAGNOSIS — R293 Abnormal posture: Secondary | ICD-10-CM

## 2018-08-06 DIAGNOSIS — R278 Other lack of coordination: Secondary | ICD-10-CM

## 2018-08-06 NOTE — Therapy (Signed)
[facility] [address] [place], [place], [address] Phone: [phone]   Fax:  [phone]  Physical Therapy Treatment  Patient Details  Name: Annette Lawson MRN: [id number] Date of Birth: [birth year] Referring Provider (PT): Prentice Docker, MD   Encounter Date: 08/06/2018  PT End of Session - 08/06/18 1410    Visit Number  4    Number of Visits  17    Date for PT Re-Evaluation  09/18/18    PT Start Time  9794    PT Stop Time  1503    PT Time Calculation (min)  55 min    Activity Tolerance  Patient limited by pain;Patient tolerated treatment well    Behavior During Therapy  [facility] for tasks assessed/performed       Past Medical History:  Diagnosis Date  . ADD (attention deficit disorder)   . Cholestasis during pregnancy   . Depression   . Vaginal Pap smear, abnormal     Past Surgical History:  Procedure Laterality Date  . WISDOM TOOTH EXTRACTION      There were no vitals filed for this visit.  Subjective Assessment - 08/06/18 1408    Subjective  Patient notes that she feels things are getting slowly better; and she was able to get down on the floor to clean without too much increased pain.    Currently in Pain?  Yes    Pain Score  3     Pain Location  Pelvis    Pain Orientation  Anterior;Distal    Pain Descriptors / Indicators  Aching       TREATMENT  Pre-treatment assessment: L ASIS superior and posterior; L pubic tubercle superior and posterior  Manual Therapy: Gentle pubic symphysis mobilizations Sacral border mobilizations, B Sacrotuberous ligament STM, L  L inominate inferior/anterior mobilizations with movement B gluteal region and sacral ligament STM in various positions (L>R) L hip LAD x5 min oscillations for decreased muscle spasm and tension at L sacral border  Neuromuscular Re-education: Supine abdominal activation with TCs for linea alba closure and coordinated breath x10 Supine pelvic tilts  x20 Seated abdominal activation with TCs for linea alba closure and coordinated breath x10  Treatments unbilled: MHP to lumbar/sacral region in supine and sidelying positions  Post-treatment assessment: L pubic tubercle slightly superior, ASIS relatively level  Patient educated throughout session on appropriate technique and form using multi-modal cueing, HEP, and activity modification. Patient articulated understanding and returned demonstration.  Patient Response to interventions: Patient reports relief from manual and pelvic tilts  ASSESSMENT Patient presents to clinic with excellent motivation to participate in therapy. Patient demonstrates deficits in posture, pain, pubic alignment, pain free spinal and hip ROM, and coordination as evidenced by discomfort and high pain with transitional movements. Patient able to achieve decreased spasm in musculature attaching to sacrum B during today's session and responded positively to manual interventions. Patient will benefit from continued skilled therapeutic intervention to address remaining deficits in posture, pain, pubic alignment, pain free spinal and hip ROM, and coordination in order to increase function, and improve overall QOL.           PT Long Term Goals - 07/24/18 1640      PT LONG TERM GOAL #1   Title  Pt will increase FOTO (mODI) to at least 79 points in order demonstrate clinically significant reduction in back pain/disability.    Baseline  IE: 65    Time  8    Period  Weeks  Status  New    Target Date  09/18/18      PT LONG TERM GOAL #2   Title  Pt will decrease worst pain as reported on NPRS by at least 2 points in order to demonstrate clinically significant reduction in back pain.    Baseline  IE: 4/10    Time  8    Period  Weeks    Status  New    Target Date  09/18/18      PT LONG TERM GOAL #3   Title  Patient will demonstrate improved lumbar and hip ROM in isolation from pelvic complex and in absence of  compensatory patterns and pain to indicate adequate ROM and function for return to participation in wellness activities.    Baseline  IE: unable and painful    Time  8    Period  Weeks    Status  New    Target Date  09/18/18      PT LONG TERM GOAL #4   Title  Patient will demonstrate maintained and level pubic symphysis alignment for 2 weeks with independent adherence to HEP in order to transition to self-management safely and effectively.    Baseline  IE: L pubic tubercle inferior and posterior shear    Time  8    Period  Weeks    Target Date  09/18/18            Plan - 08/06/18 1542    Clinical Impression Statement  Patient presents to clinic with excellent motivation to participate in therapy. Patient demonstrates deficits in posture, pain, pubic alignment, pain free spinal and hip ROM, and coordination as evidenced by discomfort and high pain with transitional movements. Patient able to achieve decreased spasm in musculature attaching to sacrum B during today's session and responded positively to manual interventions. Patient will benefit from continued skilled therapeutic intervention to address remaining deficits in posture, pain, pubic alignment, pain free spinal and hip ROM, and coordination in order to increase function, and improve overall QOL.    Personal Factors and Comorbidities  Education;Sex;Past/Current Experience;Time since onset of injury/illness/exacerbation;Fitness;Profession;Behavior Pattern    Examination-Activity Limitations  Squat;Stairs;Locomotion Level;Stand;Caring for Others;Sit;Transfers;Sleep;Bed Mobility    Examination-Participation Restrictions  Driving;Meal Prep;Yard Work;Cleaning;Interpersonal Relationship;Laundry;Shop    Stability/Clinical Decision Making  Evolving/Moderate complexity    Rehab Potential  Good    PT Frequency  2x / week    PT Duration  8 weeks    PT Treatment/Interventions  ADLs/Self Care Home Management;Aquatic Therapy;Cryotherapy;Moist  Heat;Stair training;Gait training;Therapeutic exercise;Balance training;Patient/family education;Therapeutic activities;Functional mobility training;Neuromuscular re-education;Manual techniques;Passive range of motion;Dry needling;Scar mobilization;Spinal Manipulations;Joint Manipulations;Compression bandaging    PT Next Visit Plan  Pubic Symphysis MET and quick stretch    PT Home Exercise Plan  Gentle pelvic tilts and knees to chest stretch    Consulted and Agree with Plan of Care  Patient       Patient will benefit from skilled therapeutic intervention in order to improve the following deficits and impairments:  Abnormal gait, Decreased endurance, Decreased mobility, Difficulty walking, Increased muscle spasms, Pain, Postural dysfunction, Decreased strength, Decreased coordination, Decreased activity tolerance, Improper body mechanics, Decreased range of motion  Visit Diagnosis: 1. Low back pain, unspecified back pain laterality, unspecified chronicity, unspecified whether sciatica present   2. Abnormal posture   3. Other lack of coordination        Problem List Patient Active Problem List   Diagnosis Date Noted  . Normal labor 06/28/2018  . GBS (group B Streptococcus  carrier), +RV culture, currently pregnant 06/17/2018  . Back pain affecting pregnancy 06/11/2018  . Supervision of high risk pregnancy, antepartum 11/20/2017  . Rh negative state in antepartum period 11/20/2017  . History of cholestasis during pregnancy 11/20/2017  . Depression affecting pregnancy 11/20/2017  . History of postpartum hemorrhage, currently pregnant 11/20/2017  . Multinodular goiter 07/02/2017  . Vitamin D deficiency 02/07/2017  . Postpartum care following vaginal delivery (11/22) 12/01/2015  . ADD (attention deficit disorder) 02/22/2015   Myles Gip PT, DPT [phone] 08/06/2018, 3:49 PM  [facility] [address] [place], [place], [address] Phone:  [phone]   Fax:  [phone]  Name: Annette Lawson MRN: [id number] Date of Birth: 1985/07/19

## 2018-08-07 NOTE — Telephone Encounter (Signed)
Reviewed chart as well as PT's notes. I will not continue medicine based on what I see.  Can discuss more w Dr Glennon Mac upon return, as he has seen the patient since delivery and started the PT (although no mention of narcotics in his notes).

## 2018-08-13 ENCOUNTER — Other Ambulatory Visit: Payer: Self-pay | Admitting: Obstetrics and Gynecology

## 2018-08-13 ENCOUNTER — Encounter: Payer: BC Managed Care – PPO | Admitting: Physical Therapy

## 2018-08-13 ENCOUNTER — Other Ambulatory Visit: Payer: Self-pay

## 2018-08-13 DIAGNOSIS — R102 Pelvic and perineal pain: Secondary | ICD-10-CM

## 2018-08-13 MED ORDER — OXYCODONE-ACETAMINOPHEN 5-325 MG PO TABS: 1.0000 | ORAL_TABLET | Freq: Two times a day (BID) | ORAL | 0 refills | Status: DC | PRN

## 2018-08-14 ENCOUNTER — Encounter: Payer: Self-pay | Admitting: Physical Therapy

## 2018-08-14 ENCOUNTER — Ambulatory Visit: Payer: BC Managed Care – PPO | Attending: Obstetrics and Gynecology | Admitting: Physical Therapy

## 2018-08-14 ENCOUNTER — Encounter: Payer: Self-pay | Admitting: Obstetrics and Gynecology

## 2018-08-14 ENCOUNTER — Other Ambulatory Visit: Payer: Self-pay

## 2018-08-14 ENCOUNTER — Ambulatory Visit (INDEPENDENT_AMBULATORY_CARE_PROVIDER_SITE_OTHER): Payer: BC Managed Care – PPO | Admitting: Obstetrics and Gynecology

## 2018-08-14 DIAGNOSIS — R293 Abnormal posture: Secondary | ICD-10-CM | POA: Insufficient documentation

## 2018-08-14 DIAGNOSIS — R278 Other lack of coordination: Secondary | ICD-10-CM | POA: Insufficient documentation

## 2018-08-14 DIAGNOSIS — Z30011 Encounter for initial prescription of contraceptive pills: Secondary | ICD-10-CM

## 2018-08-14 DIAGNOSIS — Z1389 Encounter for screening for other disorder: Secondary | ICD-10-CM | POA: Diagnosis not present

## 2018-08-14 DIAGNOSIS — M545 Low back pain, unspecified: Secondary | ICD-10-CM

## 2018-08-14 DIAGNOSIS — N898 Other specified noninflammatory disorders of vagina: Secondary | ICD-10-CM

## 2018-08-14 DIAGNOSIS — R102 Pelvic and perineal pain: Secondary | ICD-10-CM

## 2018-08-14 MED ORDER — NORETHINDRONE 0.35 MG PO TABS: 1.0000 | ORAL_TABLET | Freq: Every day | ORAL | 4 refills | Status: DC

## 2018-08-14 MED ORDER — METAXALONE 800 MG PO TABS: 800.0000 mg | ORAL_TABLET | Freq: Three times a day (TID) | ORAL | 1 refills | Status: DC | PRN

## 2018-08-14 NOTE — Progress Notes (Signed)
Postpartum Visit   Chief Complaint  Patient presents with  . Postpartum Care    Vaginal delivery 6/19   History of Present Illness: Patient is a 33 y.o. [id number] presents for postpartum visit.  Date of delivery: 06/28/2018 Type of delivery: Vaginal delivery - Vacuum or forceps assisted  no Episiotomy No.  Laceration: yes (1st degree) Pregnancy or labor problems:  no Any problems since the delivery:  Yes, mastitis and pelvic pain (now seeing PT for pelvic pain)  Newborn Details:  SINGLETON :  1. Birth weight: 3,660 grams  Maternal Details:  Breast Feeding:  Yes and formula Post partum depression/anxiety noted:  no Edinburgh Post-Partum Depression Score:  9  Date of last PAP: 11/20/2017  normal   Past Medical History:  Diagnosis Date  . ADD (attention deficit disorder)   . Cholestasis during pregnancy   . Depression   . Vaginal Pap smear, abnormal     Past Surgical History:  Procedure Laterality Date  . WISDOM TOOTH EXTRACTION      Prior to Admission medications   Medication Sig Start Date End Date Taking? Authorizing Provider  acetaminophen (TYLENOL) 500 MG tablet Take 500 mg by mouth every 6 (six) hours as needed for mild pain or headache.   Yes [provider]  amphetamine-dextroamphetamine (ADDERALL) 10 MG tablet Take 10 mg by mouth daily with breakfast. Twice a day.   Yes [provider]  cyclobenzaprine (FLEXERIL) 10 MG tablet Take 1 tablet (10 mg total) by mouth every 8 (eight) hours as needed for muscle spasms. 07/10/18  Yes Will Bonnet, MD  oxyCODONE-acetaminophen (PERCOCET) 5-325 MG tablet Take 1 tablet by mouth every 12 (twelve) hours as needed for severe pain. 08/13/18  Yes Will Bonnet, MD  Prenatal Vit-Fe Fumarate-FA (PRENATAL MULTIVITAMIN) TABS tablet Take 1 tablet by mouth daily at 12 noon. 12/04/15  Yes Juliene Pina, CNM    No Known Allergies   Social History   Socioeconomic History  . Marital status: Married    Spouse  name: Not on file  . Number of children: Not on file  . Years of education: Not on file  . Highest education level: Not on file  Occupational History  . Not on file  Social Needs  . Financial resource strain: Not on file  . Food insecurity    Worry: Not on file    Inability: Not on file  . Transportation needs    Medical: Not on file    Non-medical: Not on file  Tobacco Use  . Smoking status: Current Some Day Smoker  . Smokeless tobacco: Never Used  Substance and Sexual Activity  . Alcohol use: Yes    Alcohol/week: 0.0 standard drinks    Comment: Occasional  . Drug use: No  . Sexual activity: Yes    Birth control/protection: None  Lifestyle  . Physical activity    Days per week: Not on file    Minutes per session: Not on file  . Stress: Not on file  Relationships  . Social Herbalist on phone: Not on file    Gets together: Not on file    Attends religious service: Not on file    Active member of club or organization: Not on file    Attends meetings of clubs or organizations: Not on file    Relationship status: Not on file  . Intimate partner violence    Fear of current or ex partner: Not on file  Emotionally abused: Not on file    Physically abused: Not on file    Forced sexual activity: Not on file  Other Topics Concern  . Not on file  Social History Narrative  . Not on file    Family History  Problem Relation Age of Onset  . Hypothyroidism Mother   . COPD Maternal Grandmother        reactive airway disease    Review of Systems  Constitutional: Negative.   HENT: Negative.   Eyes: Negative.   Respiratory: Negative.   Cardiovascular: Negative.   Gastrointestinal: Negative.   Genitourinary: Negative.   Musculoskeletal: Positive for back pain, joint pain and myalgias. Negative for falls and neck pain.  Skin: Negative.   Neurological: Negative.   Psychiatric/Behavioral: Negative.      Physical Exam BP 114/81   Pulse 72   Wt 204 lb (92.5  kg)   LMP 08/10/2018   Breastfeeding Yes   BMI 32.93 kg/m   Physical Exam Constitutional:      General: She is not in acute distress.    Appearance: Normal appearance. She is well-developed.  Genitourinary:     Pelvic exam was performed with patient in the lithotomy position.     Vulva, inguinal canal, urethra, bladder, uterus, right adnexa and left adnexa normal.     No posterior fourchette tenderness, injury or lesion present.     There are lesions in the vagina.     No signs of injury in the vagina.     Vaginal rugosity present.     No vaginal erythema, bleeding or ulceration.     Vaginal exam comments: At about 7 o'clock just inside the hymenal ring there is a ~3 mm inclusion cyst noted, non-tender, white, mobile..     No cervical friability, lesion, bleeding or polyp.  HENT:     Head: Normocephalic and atraumatic.  Eyes:     General: No scleral icterus.    Conjunctiva/sclera: Conjunctivae normal.  Neck:     Musculoskeletal: Normal range of motion and neck supple.  Cardiovascular:     Rate and Rhythm: Normal rate and regular rhythm.     Heart sounds: No murmur. No friction rub. No gallop.   Pulmonary:     Effort: Pulmonary effort is normal. No respiratory distress.     Breath sounds: Normal breath sounds. No wheezing or rales.  Abdominal:     General: Bowel sounds are normal. There is no distension.     Palpations: Abdomen is soft. There is no mass.     Tenderness: There is no abdominal tenderness. There is no guarding or rebound.  Musculoskeletal: Normal range of motion.  Neurological:     General: No focal deficit present.     Mental Status: She is alert and oriented to person, place, and time.     Cranial Nerves: No cranial nerve deficit.  Skin:    General: Skin is warm and dry.     Findings: No erythema.  Psychiatric:        Mood and Affect: Mood normal.        Behavior: Behavior normal.        Judgment: Judgment normal.      Female Chaperone present during  breast and/or pelvic exam.  Assessment: 33 y.o. [id number] presenting for 6 week postpartum visit  Plan: Problem List Items Addressed This Visit      Other   Postpartum care following vaginal delivery (11/22) - Primary   Relevant Medications  metaxalone (SKELAXIN) 800 MG tablet   norethindrone (MICRONOR) 0.35 MG tablet    Other Visit Diagnoses    Pelvic pain       Relevant Medications   metaxalone (SKELAXIN) 800 MG tablet   Vaginal inclusion cyst       Encounter for initial prescription of contraceptive pills       Relevant Medications   norethindrone (MICRONOR) 0.35 MG tablet       1) Contraception Education given regarding options for contraception, including oral contraceptives. The plan is to have her husband get a vasectomy at some point.Marland Kitchen  2)  Pap - ASCCP guidelines and rational discussed.  Patient opts for routine screening interval  3) Patient underwent screening for postpartum depression with some mild concerns noted. Patient states she had one bad day within the last 7 days, which raised her score. But, she is otherwise doing ok according to her.   4) Inclusion cyst: we discussed that she might notice the inclusion cyst. If it is bothersome, it could be removed in clinic.   4) Follow up 1 year for routine annual exam  Prentice Docker, MD 08/14/2018 6:23 PM

## 2018-08-14 NOTE — Therapy (Signed)
[place] [facility] [address] [place], [place], [address] Phone: [phone]   Fax:  [phone]  Physical Therapy Treatment  Patient Details  Name: Annette Lawson MRN: [id number] Date of Birth: [birth year] Referring Provider (PT): Prentice Docker, MD   Encounter Date: 08/14/2018  PT End of Session - 08/14/18 1107    Visit Number  5    Number of Visits  17    Date for PT Re-Evaluation  09/18/18    PT Start Time  1100    PT Stop Time  1155    PT Time Calculation (min)  55 min    Activity Tolerance  Patient tolerated treatment well    Behavior During Therapy  [facility] for tasks assessed/performed       Past Medical History:  Diagnosis Date  . ADD (attention deficit disorder)   . Cholestasis during pregnancy   . Depression   . Vaginal Pap smear, abnormal     Past Surgical History:  Procedure Laterality Date  . WISDOM TOOTH EXTRACTION      There were no vitals filed for this visit.  Subjective Assessment - 08/14/18 1104    Subjective  Patient states that she took it easy over the weekend and didn't get too many stretches done. She has been using the heating pad when she is feeding which she feels is helping to loosen things up. She states that she still feels her pelvis is a little off in a long sitting position, but there is no pain in the position; "it feels more natural to sit left over right" with legs out long crossed at ankles.    Currently in Pain?  Yes    Pain Score  1        TREATMENT  Pre-treatment assessment: L iliac crest elevated, ASIS in same plane, no large indication of rotation  Manual Therapy: STM and TPR performed to L gluteal complex and scarotuberous and sacrospinous ligaments to allow for decreased tension and pain and improved posture and function Sacral mobilizations for decreased spasm and improved mobility  Neuromuscular Re-education: Sidelying L QL stretch x2 min Sidelying L piriformis stretch x2  min 1 cm heel lift in R shoe to decrease functional discrepancy between limbs and allow for more balanced pelvis, gait in hallway Seated hip ER/abduction, RTB, for improved muscular support at the sacrum and hips in functional positions, x30   Patient educated throughout session on appropriate technique and form using multi-modal cueing, HEP, and activity modification. Patient articulated understanding and returned demonstration.  Patient Response to interventions: Patient denies any increased pain  ASSESSMENT Patient presents to clinic with excellent motivation to participate in therapy. Patient demonstrates deficits in posture, pain, pubic alignment, pain free spinal and hip ROM, and coordination as evidenced by discomfort and high pain with transitional movements. Patient able to achieve decreased spasm in musculature attaching to sacrum L during today's session and responded positively to manual interventions. Patient will benefit from continued skilled therapeutic intervention to address remaining deficits in posture, pain, pubic alignment, pain free spinal and hip ROM, and coordination in order to increase function, and improve overall QOL.        PT Long Term Goals - 07/24/18 1640      PT LONG TERM GOAL #1   Title  Pt will increase FOTO (mODI) to at least 79 points in order demonstrate clinically significant reduction in back pain/disability.    Baseline  IE: 65    Time  8  Period  Weeks    Status  New    Target Date  09/18/18      PT LONG TERM GOAL #2   Title  Pt will decrease worst pain as reported on NPRS by at least 2 points in order to demonstrate clinically significant reduction in back pain.    Baseline  IE: 4/10    Time  8    Period  Weeks    Status  New    Target Date  09/18/18      PT LONG TERM GOAL #3   Title  Patient will demonstrate improved lumbar and hip ROM in isolation from pelvic complex and in absence of compensatory patterns and pain to indicate  adequate ROM and function for return to participation in wellness activities.    Baseline  IE: unable and painful    Time  8    Period  Weeks    Status  New    Target Date  09/18/18      PT LONG TERM GOAL #4   Title  Patient will demonstrate maintained and level pubic symphysis alignment for 2 weeks with independent adherence to HEP in order to transition to self-management safely and effectively.    Baseline  IE: L pubic tubercle inferior and posterior shear    Time  8    Period  Weeks    Target Date  09/18/18            Plan - 08/14/18 1108    Clinical Impression Statement  Patient presents to clinic with excellent motivation to participate in therapy. Patient demonstrates deficits in posture, pain, pubic alignment, pain free spinal and hip ROM, and coordination as evidenced by discomfort and high pain with transitional movements. Patient able to achieve decreased spasm in musculature attaching to sacrum L during today's session and responded positively to manual interventions. Patient will benefit from continued skilled therapeutic intervention to address remaining deficits in posture, pain, pubic alignment, pain free spinal and hip ROM, and coordination in order to increase function, and improve overall QOL.    Personal Factors and Comorbidities  Education;Sex;Past/Current Experience;Time since onset of injury/illness/exacerbation;Fitness;Profession;Behavior Pattern    Examination-Activity Limitations  Squat;Stairs;Locomotion Level;Stand;Caring for Others;Sit;Transfers;Sleep;Bed Mobility    Examination-Participation Restrictions  Driving;Meal Prep;Yard Work;Cleaning;Interpersonal Relationship;Laundry;Shop    Stability/Clinical Decision Making  Evolving/Moderate complexity    Rehab Potential  Good    PT Frequency  2x / week    PT Duration  8 weeks    PT Treatment/Interventions  ADLs/Self Care Home Management;Aquatic Therapy;Cryotherapy;Moist Heat;Stair training;Gait  training;Therapeutic exercise;Balance training;Patient/family education;Therapeutic activities;Functional mobility training;Neuromuscular re-education;Manual techniques;Passive range of motion;Dry needling;Scar mobilization;Spinal Manipulations;Joint Manipulations;Compression bandaging    PT Next Visit Plan  Pubic Symphysis MET and quick stretch    PT Home Exercise Plan  Gentle pelvic tilts and knees to chest stretch    Consulted and Agree with Plan of Care  Patient       Patient will benefit from skilled therapeutic intervention in order to improve the following deficits and impairments:  Abnormal gait, Decreased endurance, Decreased mobility, Difficulty walking, Increased muscle spasms, Pain, Postural dysfunction, Decreased strength, Decreased coordination, Decreased activity tolerance, Improper body mechanics, Decreased range of motion  Visit Diagnosis: 1. Low back pain, unspecified back pain laterality, unspecified chronicity, unspecified whether sciatica present   2. Abnormal posture   3. Other lack of coordination        Problem List Patient Active Problem List   Diagnosis Date Noted  . Normal labor 06/28/2018  .  GBS (group B Streptococcus carrier), +RV culture, currently pregnant 06/17/2018  . Back pain affecting pregnancy 06/11/2018  . Supervision of high risk pregnancy, antepartum 11/20/2017  . Rh negative state in antepartum period 11/20/2017  . History of cholestasis during pregnancy 11/20/2017  . Depression affecting pregnancy 11/20/2017  . History of postpartum hemorrhage, currently pregnant 11/20/2017  . Multinodular goiter 07/02/2017  . Vitamin D deficiency 02/07/2017  . Postpartum care following vaginal delivery (11/22) 12/01/2015  . ADD (attention deficit disorder) 02/22/2015   Myles Gip PT, DPT [phone] 08/14/2018, 2:21 PM  [facility] [address] [place], [place], [address] Phone: [phone]   Fax:   [phone]  Name: Annette Lawson MRN: [id number] Date of Birth: [birth year]

## 2018-08-20 ENCOUNTER — Encounter: Payer: BC Managed Care – PPO | Admitting: Physical Therapy

## 2018-08-21 ENCOUNTER — Other Ambulatory Visit: Payer: Self-pay

## 2018-08-21 ENCOUNTER — Encounter: Payer: Self-pay | Admitting: Physical Therapy

## 2018-08-21 ENCOUNTER — Ambulatory Visit: Payer: BC Managed Care – PPO | Admitting: Physical Therapy

## 2018-08-21 DIAGNOSIS — M545 Low back pain, unspecified: Secondary | ICD-10-CM

## 2018-08-21 DIAGNOSIS — R278 Other lack of coordination: Secondary | ICD-10-CM

## 2018-08-21 DIAGNOSIS — R293 Abnormal posture: Secondary | ICD-10-CM

## 2018-08-21 NOTE — Therapy (Signed)
[place] [facility] [address] [place], [place], [address] Phone: [phone]   Fax:  [phone]  Physical Therapy Treatment  Patient Details  Name: Annette Lawson MRN: [id number] Date of Birth: [birth year] Referring Provider (PT): Prentice Docker, MD   Encounter Date: 08/21/2018  PT End of Session - 08/21/18 1508    Visit Number  6    Number of Visits  17    Date for PT Re-Evaluation  09/18/18    PT Start Time  1500    PT Stop Time  1555    PT Time Calculation (min)  55 min    Activity Tolerance  Patient tolerated treatment well    Behavior During Therapy  [facility] for tasks assessed/performed       Past Medical History:  Diagnosis Date  . ADD (attention deficit disorder)   . Cholestasis during pregnancy   . Depression   . Vaginal Pap smear, abnormal     Past Surgical History:  Procedure Laterality Date  . WISDOM TOOTH EXTRACTION      There were no vitals filed for this visit.  Subjective Assessment - 08/21/18 1506    Subjective  Patient reports that she was able to come to therapy without pain medication today. She notes that things feel like they are moving in the right direction. She is exicted to be at a 1/10 without pain medication and has been able to sit on the floor to play with her children more.    Currently in Pain?  Yes    Pain Score  1     Pain Location  Sacrum    Pain Orientation  Left    Pain Descriptors / Indicators  Aching       TREATMENT  Pre-treatment assessment: L iliac crest elevated and superiorly/posteriorly rotated  Manual Therapy: STM and TPR performed to L gluteal complex and scarotuberous and sacrospinous ligaments to allow for decreased tension and pain and improved posture and function Sacral mobilizations for decreased spasm and improved mobility  Neuromuscular Re-education: Sidelying TrA activation with coordinated breath x10 Sidelying TrA activation with clamshell, x5 B Sidelying, TrA  activation with heel lide, x5 B  Patient educated throughout session on appropriate technique and form using multi-modal cueing, HEP, and activity modification. Patient articulated understanding and returned demonstration.  Patient Response to interventions: Patient denies any increased pain  ASSESSMENT Patient presents to clinic with excellent motivation to participate in therapy. Patient demonstrates deficits in posture, pain, pubic alignment, pain free spinal and hip ROM, and coordination as evidenced by discomfort and high pain with transitional movements. Patient able to achieve coordination for abdominal strengthening and rehabilitation activities during today's session and responded positively to manual interventions. Patient will benefit from continued skilled therapeutic intervention to address remaining deficits in posture, pain, pubic alignment, pain free spinal and hip ROM, and coordination in order to increase function, and improve overall QOL.     PT Long Term Goals - 07/24/18 1640      PT LONG TERM GOAL #1   Title  Pt will increase FOTO (mODI) to at least 79 points in order demonstrate clinically significant reduction in back pain/disability.    Baseline  IE: 65    Time  8    Period  Weeks    Status  New    Target Date  09/18/18      PT LONG TERM GOAL #2   Title  Pt will decrease worst pain as reported on NPRS  by at least 2 points in order to demonstrate clinically significant reduction in back pain.    Baseline  IE: 4/10    Time  8    Period  Weeks    Status  New    Target Date  09/18/18      PT LONG TERM GOAL #3   Title  Patient will demonstrate improved lumbar and hip ROM in isolation from pelvic complex and in absence of compensatory patterns and pain to indicate adequate ROM and function for return to participation in wellness activities.    Baseline  IE: unable and painful    Time  8    Period  Weeks    Status  New    Target Date  09/18/18      PT LONG TERM  GOAL #4   Title  Patient will demonstrate maintained and level pubic symphysis alignment for 2 weeks with independent adherence to HEP in order to transition to self-management safely and effectively.    Baseline  IE: L pubic tubercle inferior and posterior shear    Time  8    Period  Weeks    Target Date  09/18/18            Plan - 08/21/18 1824    Clinical Impression Statement  Patient presents to clinic with excellent motivation to participate in therapy. Patient demonstrates deficits in posture, pain, pubic alignment, pain free spinal and hip ROM, and coordination as evidenced by discomfort and high pain with transitional movements. Patient able to achieve coordination for abdominal strengthening and rehabilitation activities during today's session and responded positively to manual interventions. Patient will benefit from continued skilled therapeutic intervention to address remaining deficits in posture, pain, pubic alignment, pain free spinal and hip ROM, and coordination in order to increase function, and improve overall QOL.    Personal Factors and Comorbidities  Education;Sex;Past/Current Experience;Time since onset of injury/illness/exacerbation;Fitness;Profession;Behavior Pattern    Examination-Activity Limitations  Squat;Stairs;Locomotion Level;Stand;Caring for Others;Sit;Transfers;Sleep;Bed Mobility    Examination-Participation Restrictions  Driving;Meal Prep;Yard Work;Cleaning;Interpersonal Relationship;Laundry;Shop    Stability/Clinical Decision Making  Evolving/Moderate complexity    Rehab Potential  Good    PT Frequency  2x / week    PT Duration  8 weeks    PT Treatment/Interventions  ADLs/Self Care Home Management;Aquatic Therapy;Cryotherapy;Moist Heat;Stair training;Gait training;Therapeutic exercise;Balance training;Patient/family education;Therapeutic activities;Functional mobility training;Neuromuscular re-education;Manual techniques;Passive range of motion;Dry  needling;Scar mobilization;Spinal Manipulations;Joint Manipulations;Compression bandaging    PT Next Visit Plan  Pubic Symphysis MET and quick stretch    PT Home Exercise Plan  Gentle pelvic tilts and knees to chest stretch    Consulted and Agree with Plan of Care  Patient       Patient will benefit from skilled therapeutic intervention in order to improve the following deficits and impairments:  Abnormal gait, Decreased endurance, Decreased mobility, Difficulty walking, Increased muscle spasms, Pain, Postural dysfunction, Decreased strength, Decreased coordination, Decreased activity tolerance, Improper body mechanics, Decreased range of motion  Visit Diagnosis: 1. Low back pain, unspecified back pain laterality, unspecified chronicity, unspecified whether sciatica present   2. Abnormal posture   3. Other lack of coordination        Problem List Patient Active Problem List   Diagnosis Date Noted  . Normal labor 06/28/2018  . GBS (group B Streptococcus carrier), +RV culture, currently pregnant 06/17/2018  . Back pain affecting pregnancy 06/11/2018  . Supervision of high risk pregnancy, antepartum 11/20/2017  . Rh negative state in antepartum period 11/20/2017  . History  of cholestasis during pregnancy 11/20/2017  . Depression affecting pregnancy 11/20/2017  . History of postpartum hemorrhage, currently pregnant 11/20/2017  . Multinodular goiter 07/02/2017  . Vitamin D deficiency 02/07/2017  . Postpartum care following vaginal delivery (11/22) 12/01/2015  . ADD (attention deficit disorder) 02/22/2015   Myles Gip PT, DPT [phone] 08/21/2018, 6:43 PM  [place] [facility] [address] [place], [place], [address] Phone: [phone]   Fax:  [phone]  Name: Annette Lawson MRN: [id number] Date of Birth: 1985/03/18

## 2018-08-27 ENCOUNTER — Encounter: Payer: BC Managed Care – PPO | Admitting: Physical Therapy

## 2018-08-28 ENCOUNTER — Other Ambulatory Visit: Payer: Self-pay

## 2018-08-28 ENCOUNTER — Encounter: Payer: Self-pay | Admitting: Physical Therapy

## 2018-08-28 ENCOUNTER — Ambulatory Visit: Payer: BC Managed Care – PPO | Admitting: Physical Therapy

## 2018-08-28 DIAGNOSIS — M545 Low back pain, unspecified: Secondary | ICD-10-CM

## 2018-08-28 DIAGNOSIS — R293 Abnormal posture: Secondary | ICD-10-CM

## 2018-08-28 DIAGNOSIS — R278 Other lack of coordination: Secondary | ICD-10-CM

## 2018-08-28 NOTE — Therapy (Signed)
[place] [facility] [address] [place], [place], [address] Phone: [phone]   Fax:  [phone]  Physical Therapy Treatment  Patient Details  Name: Annette Lawson MRN: [id number] Date of Birth: 1985/03/20 Referring Provider (PT): Prentice Docker, MD   Encounter Date: 08/28/2018  PT End of Session - 08/28/18 1503    Visit Number  7    Number of Visits  17    Date for PT Re-Evaluation  09/18/18    PT Start Time  1500    PT Stop Time  1555    PT Time Calculation (min)  55 min    Activity Tolerance  Patient tolerated treatment well    Behavior During Therapy  [facility] for tasks assessed/performed       Past Medical History:  Diagnosis Date  . ADD (attention deficit disorder)   . Cholestasis during pregnancy   . Depression   . Vaginal Pap smear, abnormal     Past Surgical History:  Procedure Laterality Date  . WISDOM TOOTH EXTRACTION      There were no vitals filed for this visit.  Subjective Assessment - 08/28/18 1502    Subjective  Patient states that she had a nice relaxing weekend away at the beach. She notes that she had no real pain over the weekend and was able to walk on the beach without issue. She reports today it feels a lot better than it has.       TREATMENT  Pre-treatment assessment: ASIS level; L shoulder elevate and L waist slightly elevated   Manual Therapy: STM and TPR performed to L gluteal complex and scarotuberous and sacrospinous ligaments to allow for decreased tension and pain and improved posture and function Sacral mobilizations for decreased spasm and improved mobility   Neuromuscular Re-education: Supine TrA activation with coordinated breath x10 Sidelying hip controlled articular rotations, x3 each direction  Seated mermaid stretch for improved postural awareness and decreased lateral curves of spine, B, x3 for 3 breath hold Standing, RTB, lateral flexion/ teapot exercise, B, x6 for improved  postural awareness Seated figure 4 stretch, x2 min, B  Seated progressive angular isometric loading/regressive angular isometric loading for hip ER, B, x3 bouts each    Patient educated throughout session on appropriate technique and form using multi-modal cueing, HEP, and activity modification. Patient articulated understanding and returned demonstration.  Patient Response to interventions: Patient denies any increased pain  ASSESSMENT Patient presents to clinic with excellent motivation to participate in therapy. Patient demonstrates deficits in posture, pain, pubic alignment, pain free spinal and hip ROM, and coordination as evidenced by discomfort and high pain with transitional movements. Patient able to tolerate supine position for increased duration during today's session and responded positively to neuromuscular re-education interventions. Patient will benefit from continued skilled therapeutic intervention to address remaining deficits in posture, pain, pubic alignment, pain free spinal and hip ROM, and coordination in order to increase function, and improve overall QOL.       PT Long Term Goals - 07/24/18 1640      PT LONG TERM GOAL #1   Title  Pt will increase FOTO (mODI) to at least 79 points in order demonstrate clinically significant reduction in back pain/disability.    Baseline  IE: 65    Time  8    Period  Weeks    Status  New    Target Date  09/18/18      PT LONG TERM GOAL #2   Title  Pt will decrease worst pain as reported on NPRS by at least 2 points in order to demonstrate clinically significant reduction in back pain.    Baseline  IE: 4/10    Time  8    Period  Weeks    Status  New    Target Date  09/18/18      PT LONG TERM GOAL #3   Title  Patient will demonstrate improved lumbar and hip ROM in isolation from pelvic complex and in absence of compensatory patterns and pain to indicate adequate ROM and function for return to participation in wellness  activities.    Baseline  IE: unable and painful    Time  8    Period  Weeks    Status  New    Target Date  09/18/18      PT LONG TERM GOAL #4   Title  Patient will demonstrate maintained and level pubic symphysis alignment for 2 weeks with independent adherence to HEP in order to transition to self-management safely and effectively.    Baseline  IE: L pubic tubercle inferior and posterior shear    Time  8    Period  Weeks    Target Date  09/18/18            Plan - 08/28/18 1503    Clinical Impression Statement  Patient presents to clinic with excellent motivation to participate in therapy. Patient demonstrates deficits in posture, pain, pubic alignment, pain free spinal and hip ROM, and coordination as evidenced by discomfort and high pain with transitional movements. Patient able to tolerate supine position for increased duration during today's session and responded positively to neuromuscular re-education interventions. Patient will benefit from continued skilled therapeutic intervention to address remaining deficits in posture, pain, pubic alignment, pain free spinal and hip ROM, and coordination in order to increase function, and improve overall QOL.    Personal Factors and Comorbidities  Education;Sex;Past/Current Experience;Time since onset of injury/illness/exacerbation;Fitness;Profession;Behavior Pattern    Examination-Activity Limitations  Squat;Stairs;Locomotion Level;Stand;Caring for Others;Sit;Transfers;Sleep;Bed Mobility    Examination-Participation Restrictions  Driving;Meal Prep;Yard Work;Cleaning;Interpersonal Relationship;Laundry;Shop    Stability/Clinical Decision Making  Evolving/Moderate complexity    Rehab Potential  Good    PT Frequency  2x / week    PT Duration  8 weeks    PT Treatment/Interventions  ADLs/Self Care Home Management;Aquatic Therapy;Cryotherapy;Moist Heat;Stair training;Gait training;Therapeutic exercise;Balance training;Patient/family  education;Therapeutic activities;Functional mobility training;Neuromuscular re-education;Manual techniques;Passive range of motion;Dry needling;Scar mobilization;Spinal Manipulations;Joint Manipulations;Compression bandaging    PT Next Visit Plan  Pubic Symphysis MET and quick stretch    PT Home Exercise Plan  Gentle pelvic tilts and knees to chest stretch    Consulted and Agree with Plan of Care  Patient       Patient will benefit from skilled therapeutic intervention in order to improve the following deficits and impairments:  Abnormal gait, Decreased endurance, Decreased mobility, Difficulty walking, Increased muscle spasms, Pain, Postural dysfunction, Decreased strength, Decreased coordination, Decreased activity tolerance, Improper body mechanics, Decreased range of motion  Visit Diagnosis: 1. Low back pain, unspecified back pain laterality, unspecified chronicity, unspecified whether sciatica present   2. Abnormal posture   3. Other lack of coordination        Problem List Patient Active Problem List   Diagnosis Date Noted  . Normal labor 06/28/2018  . GBS (group B Streptococcus carrier), +RV culture, currently pregnant 06/17/2018  . Back pain affecting pregnancy 06/11/2018  . Supervision of high risk pregnancy, antepartum 11/20/2017  . Rh negative  state in antepartum period 11/20/2017  . History of cholestasis during pregnancy 11/20/2017  . Depression affecting pregnancy 11/20/2017  . History of postpartum hemorrhage, currently pregnant 11/20/2017  . Multinodular goiter 07/02/2017  . Vitamin D deficiency 02/07/2017  . Postpartum care following vaginal delivery (11/22) 12/01/2015  . ADD (attention deficit disorder) 02/22/2015   Myles Gip PT, DPT [phone] 08/28/2018, 6:12 PM  [place] [facility] [address] [place], [place], [address] Phone: [phone]   Fax:  [phone]  Name: Annette Lawson MRN: [id number] Date of Birth:  03-17-1985

## 2018-09-03 ENCOUNTER — Encounter: Payer: BC Managed Care – PPO | Admitting: Physical Therapy

## 2018-09-03 ENCOUNTER — Ambulatory Visit: Payer: BC Managed Care – PPO | Admitting: Physical Therapy

## 2018-09-05 ENCOUNTER — Ambulatory Visit: Payer: BC Managed Care – PPO | Admitting: Physical Therapy

## 2018-09-05 ENCOUNTER — Other Ambulatory Visit: Payer: Self-pay

## 2018-09-05 ENCOUNTER — Encounter: Payer: Self-pay | Admitting: Physical Therapy

## 2018-09-05 DIAGNOSIS — R278 Other lack of coordination: Secondary | ICD-10-CM

## 2018-09-05 DIAGNOSIS — M545 Low back pain, unspecified: Secondary | ICD-10-CM

## 2018-09-05 DIAGNOSIS — R293 Abnormal posture: Secondary | ICD-10-CM

## 2018-09-05 NOTE — Therapy (Signed)
[facility] [address] [place], [place], [address] Phone: [phone]   Fax:  [phone]  Physical Therapy Treatment  Patient Details  Name: Annette Lawson MRN: [id number] Date of Birth: 1985-10-01 Referring Provider (PT): Prentice Docker, MD   Encounter Date: 09/05/2018  PT End of Session - 09/05/18 1458    Visit Number  8    Number of Visits  17    Date for PT Re-Evaluation  09/18/18    PT Start Time  1500    PT Stop Time  1540    PT Time Calculation (min)  40 min    Activity Tolerance  Patient tolerated treatment well    Behavior During Therapy  [facility] for tasks assessed/performed       Past Medical History:  Diagnosis Date  . ADD (attention deficit disorder)   . Cholestasis during pregnancy   . Depression   . Vaginal Pap smear, abnormal     Past Surgical History:  Procedure Laterality Date  . WISDOM TOOTH EXTRACTION      There were no vitals filed for this visit.  Subjective Assessment - 09/05/18 1502    Subjective  Patient has been feeling not herself because of starting birth control. She started having hot flashes, increased pain, headaches.      TREATMENT  Pre-treatment assessment: ASIS level, no noted pelvic obliquity  Neuromuscular Re-education: Stepping hip strategy for functional pelvic control with directional changes, x5 each leg. VCs and TCs for pelvic alignment and sequencing. Standing Pilates Postural Control activties:  Hug A Tree, x10 RTB with VCs for coordinated breath and sequencing  Serve a Tray, x10 RTB with VCs for coordinated breath and sequencing Seated Pilates Single Leg Foot work, BTB, x15 each leg with VCs for coordinated breath and sequencing Body Mechanics for sled push to mimic work postural demands, 65# + sled, push/pull 4x25 feet with VCs and TCs for postural and hip engagement.  Post-treatment assessment: ASIS level, no noted pelvic obliquity  Patient educated throughout  session on appropriate technique and form using multi-modal cueing, HEP, and activity modification. Patient articulated understanding and returned demonstration.  Patient Response to interventions: Patient denies any increased pain; reports moderate confidence in return to work on Sunday.   ASSESSMENT Patient presents to clinic with excellent motivation to participate in therapy. Patient demonstrates deficits in posture, pain, pubic alignment, pain free spinal and hip ROM, and coordination as evidenced by discomfort and high pain with transitional movements. Patient able to tolerate increased trunk and pelvic demands with functional tasks during today's session and responded positively to neuromuscular re-education interventions. Patient will benefit from continued skilled therapeutic intervention to address remaining deficits in posture, pain, pubic alignment, pain free spinal and hip ROM, and coordination in order to increase function, and improve overall QOL.     PT Long Term Goals - 07/24/18 1640      PT LONG TERM GOAL #1   Title  Pt will increase FOTO (mODI) to at least 79 points in order demonstrate clinically significant reduction in back pain/disability.    Baseline  IE: 65    Time  8    Period  Weeks    Status  New    Target Date  09/18/18      PT LONG TERM GOAL #2   Title  Pt will decrease worst pain as reported on NPRS by at least 2 points in order to demonstrate clinically significant reduction in back pain.  Baseline  IE: 4/10    Time  8    Period  Weeks    Status  New    Target Date  09/18/18      PT LONG TERM GOAL #3   Title  Patient will demonstrate improved lumbar and hip ROM in isolation from pelvic complex and in absence of compensatory patterns and pain to indicate adequate ROM and function for return to participation in wellness activities.    Baseline  IE: unable and painful    Time  8    Period  Weeks    Status  New    Target Date  09/18/18      PT LONG  TERM GOAL #4   Title  Patient will demonstrate maintained and level pubic symphysis alignment for 2 weeks with independent adherence to HEP in order to transition to self-management safely and effectively.    Baseline  IE: L pubic tubercle inferior and posterior shear    Time  8    Period  Weeks    Target Date  09/18/18            Plan - 09/05/18 1459    Clinical Impression Statement  Patient presents to clinic with excellent motivation to participate in therapy. Patient demonstrates deficits in posture, pain, pubic alignment, pain free spinal and hip ROM, and coordination as evidenced by discomfort and high pain with transitional movements. Patient able to tolerate increased trunk and pelvic demands with functional tasks during today's session and responded positively to neuromuscular re-education interventions. Patient will benefit from continued skilled therapeutic intervention to address remaining deficits in posture, pain, pubic alignment, pain free spinal and hip ROM, and coordination in order to increase function, and improve overall QOL.    Personal Factors and Comorbidities  Education;Sex;Past/Current Experience;Time since onset of injury/illness/exacerbation;Fitness;Profession;Behavior Pattern    Examination-Activity Limitations  Squat;Stairs;Locomotion Level;Stand;Caring for Others;Sit;Transfers;Sleep;Bed Mobility    Examination-Participation Restrictions  Driving;Meal Prep;Yard Work;Cleaning;Interpersonal Relationship;Laundry;Shop    Stability/Clinical Decision Making  Evolving/Moderate complexity    Rehab Potential  Good    PT Frequency  2x / week    PT Duration  8 weeks    PT Treatment/Interventions  ADLs/Self Care Home Management;Aquatic Therapy;Cryotherapy;Moist Heat;Stair training;Gait training;Therapeutic exercise;Balance training;Patient/family education;Therapeutic activities;Functional mobility training;Neuromuscular re-education;Manual techniques;Passive range of  motion;Dry needling;Scar mobilization;Spinal Manipulations;Joint Manipulations;Compression bandaging    PT Next Visit Plan  Pubic Symphysis MET and quick stretch    PT Home Exercise Plan  Gentle pelvic tilts and knees to chest stretch    Consulted and Agree with Plan of Care  Patient       Patient will benefit from skilled therapeutic intervention in order to improve the following deficits and impairments:  Abnormal gait, Decreased endurance, Decreased mobility, Difficulty walking, Increased muscle spasms, Pain, Postural dysfunction, Decreased strength, Decreased coordination, Decreased activity tolerance, Improper body mechanics, Decreased range of motion  Visit Diagnosis: Low back pain, unspecified back pain laterality, unspecified chronicity, unspecified whether sciatica present  Abnormal posture  Other lack of coordination     Problem List Patient Active Problem List   Diagnosis Date Noted  . Normal labor 06/28/2018  . GBS (group B Streptococcus carrier), +RV culture, currently pregnant 06/17/2018  . Back pain affecting pregnancy 06/11/2018  . Supervision of high risk pregnancy, antepartum 11/20/2017  . Rh negative state in antepartum period 11/20/2017  . History of cholestasis during pregnancy 11/20/2017  . Depression affecting pregnancy 11/20/2017  . History of postpartum hemorrhage, currently pregnant 11/20/2017  . Multinodular goiter  07/02/2017  . Vitamin D deficiency 02/07/2017  . Postpartum care following vaginal delivery (11/22) 12/01/2015  . ADD (attention deficit disorder) 02/22/2015   Myles Gip PT, DPT [phone] 09/05/2018, 4:19 PM  [facility] [address] [place], [place], [address] Phone: [phone]   Fax:  [phone]  Name: Annette Lawson MRN: [id number] Date of Birth: May 17, 1985

## 2018-09-10 ENCOUNTER — Encounter: Payer: BC Managed Care – PPO | Admitting: Physical Therapy

## 2018-09-12 ENCOUNTER — Ambulatory Visit: Payer: BC Managed Care – PPO | Admitting: Physical Therapy

## 2018-09-17 ENCOUNTER — Other Ambulatory Visit: Payer: Self-pay

## 2018-09-17 ENCOUNTER — Ambulatory Visit: Payer: BC Managed Care – PPO | Attending: Obstetrics and Gynecology | Admitting: Physical Therapy

## 2018-09-17 ENCOUNTER — Encounter: Payer: BC Managed Care – PPO | Admitting: Physical Therapy

## 2018-09-17 ENCOUNTER — Encounter: Payer: Self-pay | Admitting: Physical Therapy

## 2018-09-17 DIAGNOSIS — R293 Abnormal posture: Secondary | ICD-10-CM | POA: Insufficient documentation

## 2018-09-17 DIAGNOSIS — M545 Low back pain, unspecified: Secondary | ICD-10-CM

## 2018-09-17 DIAGNOSIS — R278 Other lack of coordination: Secondary | ICD-10-CM | POA: Insufficient documentation

## 2018-09-17 NOTE — Therapy (Signed)
[facility] [place] [facility] [address] [place], [place], [address] Phone: [phone]   Fax:  [phone]  Physical Therapy Treatment  Patient Details  Name: Annette Lawson MRN: [id number] Date of Birth: 05-05-1985 Referring Provider (PT): Prentice Docker, MD   Encounter Date: 09/17/2018  PT End of Session - 09/17/18 1405    Visit Number  9    Number of Visits  17    Date for PT Re-Evaluation  09/18/18    PT Start Time  1400    PT Stop Time  1455    PT Time Calculation (min)  55 min    Activity Tolerance  Patient tolerated treatment well    Behavior During Therapy  [facility] for tasks assessed/performed       Past Medical History:  Diagnosis Date  . ADD (attention deficit disorder)   . Cholestasis during pregnancy   . Depression   . Vaginal Pap smear, abnormal     Past Surgical History:  Procedure Laterality Date  . WISDOM TOOTH EXTRACTION      There were no vitals filed for this visit.  Subjective Assessment - 09/17/18 1401    Subjective  Patient notes that her return to work has been exhausting. She has had lumbar and sacral pain since starting back. She notes that wearing her postpartum abdominal brace has been helpful for managing pain during her shifts. She has not had time to do stretches.    Currently in Pain?  Yes    Pain Score  3     Pain Location  Sacrum    Pain Descriptors / Indicators  Discomfort;Pressure;Aching       TREATMENT  Pre-treatment assessment: R ASIS level slightly elevated; R ASIS slightly anterior  Manual Therapy: STM and TPR performed to R gluteal complex and scarotuberous and sacrospinous ligaments to allow for decreased tension and pain and improved posture and function Sacral mobilizations for decreased spasm and improved mobility R innominate posterior mobilizations with hip flexion for improved joint positional awareness and mobility  Neuromuscular Re-education: Sidelying clamshells, x15 Seated  figure 4 stretch, x2 min, B  Seated pelvic rotation correction isometrics 5 sec x5 Seated cat/cow x10 in pain free ROM Seated TrA activation 3 sec x5 Patient education on incorporating HEP activities in to daily activities for improved adherence/outcomes.     Patient educated throughout session on appropriate technique and form using multi-modal cueing, HEP, and activity modification. Patient articulated understanding and returned demonstration.  Patient Response to interventions: Patient reports decreased pain  ASSESSMENT Patient presents to clinic with excellent motivation to participate in therapy. Patient demonstrates deficits in posture, pain, pubic alignment, pain free spinal and hip ROM, and coordination as evidenced by discomfort and high pain with transitional movements. Patient able to achieve decreased pain during today's session and responded positively to neuromuscular re-education interventions. Patient will benefit from continued skilled therapeutic intervention to address remaining deficits in posture, pain, pubic alignment, pain free spinal and hip ROM, and coordination in order to increase function, and improve overall QOL.        PT Long Term Goals - 09/17/18 1414      PT LONG TERM GOAL #1   Title  Pt will increase FOTO (mODI) to at least 79 points in order demonstrate clinically significant reduction in back pain/disability.    Baseline  IE: 65; 09/17/2018: 50    Time  8    Period  Weeks    Status  On-going  Target Date  11/12/18      PT LONG TERM GOAL #2   Title  Pt will decrease worst pain as reported on NPRS by at least 2 points in order to demonstrate clinically significant reduction in back pain.    Baseline  IE: 4/10; 09/17/2018: 4/10 after 12 hour shift and waking up on stomach    Time  8    Period  Weeks    Status  On-going    Target Date  11/12/18      PT LONG TERM GOAL #3   Title  Patient will demonstrate improved lumbar and hip ROM in isolation  from pelvic complex and in absence of compensatory patterns and pain to indicate adequate ROM and function for return to participation in wellness activities.    Baseline  IE: unable and painful; 09/17/2018: better able to isolate hip ROM    Time  8    Period  Weeks    Status  On-going    Target Date  11/12/18      PT LONG TERM GOAL #4   Title  Patient will demonstrate maintained and level pubic symphysis alignment for 2 weeks with independent adherence to HEP in order to transition to self-management safely and effectively.    Baseline  IE: L pubic tubercle inferior and posterior shear; 09/17/2018: aligned and nonpainful    Time  8    Period  Weeks    Status  Achieved    Target Date  09/18/18      PT LONG TERM GOAL #5   Title  Patient will perform Sahrmann Abdominal rehabilitation protocol through phase 4 without need for VCs and TCs in order to demonstrate ability to stabilize posture with limb movements.    Baseline  09/17/2018: phase 1    Time  8    Period  Weeks    Status  New    Target Date  11/12/18            Plan - 09/17/18 1752    Clinical Impression Statement  Patient presents to clinic with excellent motivation to participate in therapy. Patient demonstrates deficits in posture, pain, pubic alignment, pain free spinal and hip ROM, and coordination as evidenced by discomfort and high pain with transitional movements. Patient able to achieve decreased pain during today's session and responded positively to neuromuscular re-education interventions. Patient will benefit from continued skilled therapeutic intervention to address remaining deficits in posture, pain, pubic alignment, pain free spinal and hip ROM, and coordination in order to increase function, and improve overall QOL.    Personal Factors and Comorbidities  Education;Sex;Past/Current Experience;Time since onset of injury/illness/exacerbation;Fitness;Profession;Behavior Pattern    Examination-Activity Limitations   Squat;Stairs;Locomotion Level;Stand;Caring for Others;Sit;Transfers;Sleep;Bed Mobility    Examination-Participation Restrictions  Driving;Meal Prep;Yard Work;Cleaning;Interpersonal Relationship;Laundry;Shop    Stability/Clinical Decision Making  Evolving/Moderate complexity    Rehab Potential  Good    PT Frequency  2x / week    PT Duration  8 weeks    PT Treatment/Interventions  ADLs/Self Care Home Management;Aquatic Therapy;Cryotherapy;Moist Heat;Stair training;Gait training;Therapeutic exercise;Balance training;Patient/family education;Therapeutic activities;Functional mobility training;Neuromuscular re-education;Manual techniques;Passive range of motion;Dry needling;Scar mobilization;Spinal Manipulations;Joint Manipulations;Compression bandaging    PT Next Visit Plan  Pubic Symphysis MET and quick stretch    PT Home Exercise Plan  Gentle pelvic tilts and knees to chest stretch    Consulted and Agree with Plan of Care  Patient       Patient will benefit from skilled therapeutic intervention in order to improve the  following deficits and impairments:  Abnormal gait, Decreased endurance, Decreased mobility, Difficulty walking, Increased muscle spasms, Pain, Postural dysfunction, Decreased strength, Decreased coordination, Decreased activity tolerance, Improper body mechanics, Decreased range of motion  Visit Diagnosis: Low back pain, unspecified back pain laterality, unspecified chronicity, unspecified whether sciatica present  Abnormal posture  Other lack of coordination     Problem List Patient Active Problem List   Diagnosis Date Noted  . Normal labor 06/28/2018  . GBS (group B Streptococcus carrier), +RV culture, currently pregnant 06/17/2018  . Back pain affecting pregnancy 06/11/2018  . Supervision of high risk pregnancy, antepartum 11/20/2017  . Rh negative state in antepartum period 11/20/2017  . History of cholestasis during pregnancy 11/20/2017  . Depression affecting  pregnancy 11/20/2017  . History of postpartum hemorrhage, currently pregnant 11/20/2017  . Multinodular goiter 07/02/2017  . Vitamin D deficiency 02/07/2017  . Postpartum care following vaginal delivery (11/22) 12/01/2015  . ADD (attention deficit disorder) 02/22/2015   Myles Gip PT, DPT [phone] 09/17/2018, 5:52 PM  [facility] [facility] [address] [place], [place], [address] Phone: [phone]   Fax:  [phone]  Name: Annette Lawson MRN: [id number] Date of Birth: July 12, 1985

## 2018-09-22 ENCOUNTER — Other Ambulatory Visit: Payer: Self-pay

## 2018-09-22 DIAGNOSIS — R102 Pelvic and perineal pain: Secondary | ICD-10-CM

## 2018-09-23 NOTE — Telephone Encounter (Signed)
Please advise 

## 2018-09-24 ENCOUNTER — Encounter: Payer: BC Managed Care – PPO | Admitting: Physical Therapy

## 2018-09-24 MED ORDER — OXYCODONE-ACETAMINOPHEN 5-325 MG PO TABS: 1.0000 | ORAL_TABLET | Freq: Two times a day (BID) | ORAL | 0 refills | Status: DC | PRN

## 2018-09-26 ENCOUNTER — Ambulatory Visit: Payer: BC Managed Care – PPO | Admitting: Physical Therapy

## 2018-09-26 ENCOUNTER — Encounter: Payer: Self-pay | Admitting: Physical Therapy

## 2018-09-26 ENCOUNTER — Other Ambulatory Visit: Payer: Self-pay

## 2018-09-26 DIAGNOSIS — R293 Abnormal posture: Secondary | ICD-10-CM

## 2018-09-26 DIAGNOSIS — M545 Low back pain, unspecified: Secondary | ICD-10-CM

## 2018-09-26 DIAGNOSIS — R278 Other lack of coordination: Secondary | ICD-10-CM

## 2018-09-26 NOTE — Therapy (Signed)
[facility] [address] [place], [place], [address] Phone: [phone]   Fax:  [phone]  Physical Therapy Treatment Physical Therapy Progress Note   Dates of reporting period  07/24/2018   to   09/26/2018   Patient Details  Name: Annette Lawson MRN: [id number] Date of Birth: Jun 02, 1985 Referring Provider (PT): Prentice Docker, MD   Encounter Date: 09/26/2018  PT End of Session - 09/26/18 1402    Visit Number  10    Number of Visits  20    Date for PT Re-Evaluation  11/12/18    PT Start Time  7096    PT Stop Time  1451    PT Time Calculation (min)  55 min    Activity Tolerance  Patient tolerated treatment well    Behavior During Therapy  [facility] for tasks assessed/performed       Past Medical History:  Diagnosis Date  . ADD (attention deficit disorder)   . Cholestasis during pregnancy   . Depression   . Vaginal Pap smear, abnormal     Past Surgical History:  Procedure Laterality Date  . WISDOM TOOTH EXTRACTION      There were no vitals filed for this visit.  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 The transport required twisting of the spine and managing multiple loads. She does note some pain with side step out of shower at the low back. Patient reporting worst pain 5/10 this past week.    Currently in Pain?  Yes    Pain Score  3        TREATMENT  Pre-treatment assessment: R ASIS slightly anterior  Manual Therapy: STM and TPR performed to L gluteal complex and sacrotuberous and sacrospinous ligaments to allow for decreased tension and pain and improved posture and function Sacral mobilizations for decreased spasm and improved mobility L innominate anterior mobilizations with hip extension for improved joint positional awareness and  mobility  Neuromuscular Re-education: Sidelying L hip flexor stretch x2 min for decreased spasm Seated B hip flexor stretch x2 min each for decreased anterior tilt of pelvis Seated heel squeezes x30 for form closure of SIJ and decreased pain/spasm Unilateral bed mobility for increased load acceptance at pubic symphysis Reviewed HEP.     Patient educated throughout session on appropriate technique and form using multi-modal cueing, HEP, and activity modification. Patient articulated understanding and returned demonstration.  Patient Response to interventions: Patient reports decreased pain  ASSESSMENT Patient presents to clinic with excellent motivation to participate in therapy. Patient demonstrates deficits in posture, pain, pubic alignment, pain free spinal and hip ROM, and coordination as evidenced by discomfort and high pain with transitional movements. Patient able to achieve tolerate increased load transfer at pubic symphysis during today's session and responded positively to neuromuscular re-education interventions. Patient will benefit from continued skilled therapeutic intervention to address remaining deficits in posture, pain, pubic alignment, pain free spinal and hip ROM, and coordination in order to increase function, and improve overall QOL.      PT Long Term Goals - 09/17/18 1414      PT LONG TERM GOAL #1   Title  Pt will increase FOTO (mODI) to at least 79 points in order demonstrate clinically significant reduction in back pain/disability.    Baseline  IE: 65; 09/17/2018:  50    Time  8    Period  Weeks    Status  On-going    Target Date  11/12/18      PT LONG TERM GOAL #2   Title  Pt will decrease worst pain as reported on NPRS by at least 2 points in order to demonstrate clinically significant reduction in back pain.    Baseline  IE: 4/10; 09/17/2018: 4/10 after 12 hour shift and waking up on stomach    Time  8    Period  Weeks    Status  On-going    Target Date   11/12/18      PT LONG TERM GOAL #3   Title  Patient will demonstrate improved lumbar and hip ROM in isolation from pelvic complex and in absence of compensatory patterns and pain to indicate adequate ROM and function for return to participation in wellness activities.    Baseline  IE: unable and painful; 09/17/2018: better able to isolate hip ROM    Time  8    Period  Weeks    Status  On-going    Target Date  11/12/18      PT LONG TERM GOAL #4   Title  Patient will demonstrate maintained and level pubic symphysis alignment for 2 weeks with independent adherence to HEP in order to transition to self-management safely and effectively.    Baseline  IE: L pubic tubercle inferior and posterior shear; 09/17/2018: aligned and nonpainful    Time  8    Period  Weeks    Status  Achieved    Target Date  09/18/18      PT LONG TERM GOAL #5   Title  Patient will perform Sahrmann Abdominal rehabilitation protocol through phase 4 without need for VCs and TCs in order to demonstrate ability to stabilize posture with limb movements.    Baseline  09/17/2018: phase 1    Time  8    Period  Weeks    Status  New    Target Date  11/12/18            Plan - 09/26/18 1403    Clinical Impression Statement  Patient presents to clinic with excellent motivation to participate in therapy. Patient demonstrates deficits in posture, pain, pubic alignment, pain free spinal and hip ROM, and coordination as evidenced by discomfort and high pain with transitional movements. Patient able to achieve tolerate increased load transfer at pubic symphysis during today's session and responded positively to neuromuscular re-education interventions. Patient will benefit from continued skilled therapeutic intervention to address remaining deficits in posture, pain, pubic alignment, pain free spinal and hip ROM, and coordination in order to increase function, and improve overall QOL.    Personal Factors and Comorbidities   Education;Sex;Past/Current Experience;Time since onset of injury/illness/exacerbation;Fitness;Profession;Behavior Pattern    Examination-Activity Limitations  Squat;Stairs;Locomotion Level;Stand;Caring for Others;Sit;Transfers;Sleep;Bed Mobility    Examination-Participation Restrictions  Driving;Meal Prep;Yard Work;Cleaning;Interpersonal Relationship;Laundry;Shop    Stability/Clinical Decision Making  Evolving/Moderate complexity    Rehab Potential  Good    PT Frequency  2x / week    PT Duration  8 weeks    PT Treatment/Interventions  ADLs/Self Care Home Management;Aquatic Therapy;Cryotherapy;Moist Heat;Stair training;Gait training;Therapeutic exercise;Balance training;Patient/family education;Therapeutic activities;Functional mobility training;Neuromuscular re-education;Manual techniques;Passive range of motion;Dry needling;Scar mobilization;Spinal Manipulations;Joint Manipulations;Compression bandaging    PT Next Visit Plan  Pubic Symphysis MET and quick stretch    PT Home Exercise Plan  Gentle pelvic tilts and knees to chest stretch  Consulted and Agree with Plan of Care  Patient       Patient will benefit from skilled therapeutic intervention in order to improve the following deficits and impairments:  Abnormal gait, Decreased endurance, Decreased mobility, Difficulty walking, Increased muscle spasms, Pain, Postural dysfunction, Decreased strength, Decreased coordination, Decreased activity tolerance, Improper body mechanics, Decreased range of motion  Visit Diagnosis: Low back pain, unspecified back pain laterality, unspecified chronicity, unspecified whether sciatica present  Abnormal posture  Other lack of coordination     Problem List Patient Active Problem List   Diagnosis Date Noted  . Normal labor 06/28/2018  . GBS (group B Streptococcus carrier), +RV culture, currently pregnant 06/17/2018  . Back pain affecting pregnancy 06/11/2018  . Supervision of high risk pregnancy,  antepartum 11/20/2017  . Rh negative state in antepartum period 11/20/2017  . History of cholestasis during pregnancy 11/20/2017  . Depression affecting pregnancy 11/20/2017  . History of postpartum hemorrhage, currently pregnant 11/20/2017  . Multinodular goiter 07/02/2017  . Vitamin D deficiency 02/07/2017  . Postpartum care following vaginal delivery (11/22) 12/01/2015  . ADD (attention deficit disorder) 02/22/2015   Myles Gip PT, DPT [phone]  09/26/2018, 7:07 PM  [facility] [address] [place], [place], [address] Phone: [phone]   Fax:  [phone]  Name: Annette Lawson MRN: [id number] Date of Birth: 10-31-1985

## 2018-10-01 ENCOUNTER — Encounter: Payer: BC Managed Care – PPO | Admitting: Physical Therapy

## 2018-10-08 ENCOUNTER — Other Ambulatory Visit: Payer: Self-pay

## 2018-10-08 ENCOUNTER — Encounter: Payer: Self-pay | Admitting: Physical Therapy

## 2018-10-08 ENCOUNTER — Ambulatory Visit: Payer: BC Managed Care – PPO | Admitting: Physical Therapy

## 2018-10-08 DIAGNOSIS — R278 Other lack of coordination: Secondary | ICD-10-CM

## 2018-10-08 DIAGNOSIS — R293 Abnormal posture: Secondary | ICD-10-CM

## 2018-10-08 DIAGNOSIS — M545 Low back pain, unspecified: Secondary | ICD-10-CM

## 2018-10-08 NOTE — Therapy (Signed)
[place] [facility] [address] [place], [place], [address] Phone: [phone]   Fax:  [phone]  Physical Therapy Treatment  Patient Details  Name: Annette Lawson MRN: [id number] Date of Birth: 1985/12/07 Referring Provider (PT): Prentice Docker, MD   Encounter Date: 10/08/2018  PT End of Session - 10/08/18 1411    Visit Number  11    Number of Visits  20    Date for PT Re-Evaluation  11/12/18    PT Start Time  4585    PT Stop Time  1458    PT Time Calculation (min)  56 min    Activity Tolerance  Patient tolerated treatment well    Behavior During Therapy  [facility] for tasks assessed/performed       Past Medical History:  Diagnosis Date  . ADD (attention deficit disorder)   . Cholestasis during pregnancy   . Depression   . Vaginal Pap smear, abnormal     Past Surgical History:  Procedure Laterality Date  . WISDOM TOOTH EXTRACTION      There were no vitals filed for this visit.  Subjective Assessment - 10/08/18 1406    Subjective  Patient states that she is sore today and has noticed some increased R hip and R LBP. She reports that he coworkers have noticed her limping during her shift. She states that her anterior pubic pain has remained resolved. Patient no longer uses heel lift.    Currently in Pain?  Yes    Pain Score  4     Pain Location  Back    Pain Orientation  Right;Lower    Pain Descriptors / Indicators  Sore       TREATMENT  Pre-treatment assessment: R ASIS inferior   Manual Therapy: STM and TPR performed to B QL,  gluteal complex and sacrotuberous and sacrospinous ligaments to allow for decreased tension and pain and improved posture and function B Sacral mobilizations for decreased spasm and improved mobility   Neuromuscular Re-education: Sidelying B QL stretch 2x2 min for decreased spasm Standing hip hike, R, x15 for improved pelvic stabilization in SLS activities Reviewed HEP.    Patient educated  throughout session on appropriate technique and form using multi-modal cueing, HEP, and activity modification. Patient articulated understanding and returned demonstration.  Patient Response to interventions: Patient does not report increased pain  ASSESSMENT Patient presents to clinic with excellent motivation to participate in therapy. Patient demonstrates deficits in posture, pain, pubic alignment, pain free spinal and hip ROM, and coordination. Patient able to achieve decreased R gluteal tension/trigger points during today's session and responded positively to neuromuscular re-education interventions. Patient will benefit from continued skilled therapeutic intervention to address remaining deficits in posture, pain, pubic alignment, pain free spinal and hip ROM, and coordination in order to increase function, and improve overall QOL.     PT Long Term Goals - 09/17/18 1414      PT LONG TERM GOAL #1   Title  Pt will increase FOTO (mODI) to at least 79 points in order demonstrate clinically significant reduction in back pain/disability.    Baseline  IE: 65; 09/17/2018: 50    Time  8    Period  Weeks    Status  On-going    Target Date  11/12/18      PT LONG TERM GOAL #2   Title  Pt will decrease worst pain as reported on NPRS by at least 2 points in order to demonstrate clinically significant reduction  in back pain.    Baseline  IE: 4/10; 09/17/2018: 4/10 after 12 hour shift and waking up on stomach    Time  8    Period  Weeks    Status  On-going    Target Date  11/12/18      PT LONG TERM GOAL #3   Title  Patient will demonstrate improved lumbar and hip ROM in isolation from pelvic complex and in absence of compensatory patterns and pain to indicate adequate ROM and function for return to participation in wellness activities.    Baseline  IE: unable and painful; 09/17/2018: better able to isolate hip ROM    Time  8    Period  Weeks    Status  On-going    Target Date  11/12/18      PT  LONG TERM GOAL #4   Title  Patient will demonstrate maintained and level pubic symphysis alignment for 2 weeks with independent adherence to HEP in order to transition to self-management safely and effectively.    Baseline  IE: L pubic tubercle inferior and posterior shear; 09/17/2018: aligned and nonpainful    Time  8    Period  Weeks    Status  Achieved    Target Date  09/18/18      PT LONG TERM GOAL #5   Title  Patient will perform Sahrmann Abdominal rehabilitation protocol through phase 4 without need for VCs and TCs in order to demonstrate ability to stabilize posture with limb movements.    Baseline  09/17/2018: phase 1    Time  8    Period  Weeks    Status  New    Target Date  11/12/18            Plan - 10/08/18 1411    Clinical Impression Statement  Patient presents to clinic with excellent motivation to participate in therapy. Patient demonstrates deficits in posture, pain, pubic alignment, pain free spinal and hip ROM, and coordination. Patient able to achieve decreased R gluteal tension/trigger points during today's session and responded positively to neuromuscular re-education interventions. Patient will benefit from continued skilled therapeutic intervention to address remaining deficits in posture, pain, pubic alignment, pain free spinal and hip ROM, and coordination in order to increase function, and improve overall QOL.    Personal Factors and Comorbidities  Education;Sex;Past/Current Experience;Time since onset of injury/illness/exacerbation;Fitness;Profession;Behavior Pattern    Examination-Activity Limitations  Squat;Stairs;Locomotion Level;Stand;Caring for Others;Sit;Transfers;Sleep;Bed Mobility    Examination-Participation Restrictions  Driving;Meal Prep;Yard Work;Cleaning;Interpersonal Relationship;Laundry;Shop    Stability/Clinical Decision Making  Evolving/Moderate complexity    Rehab Potential  Good    PT Frequency  2x / week    PT Duration  8 weeks    PT  Treatment/Interventions  ADLs/Self Care Home Management;Aquatic Therapy;Cryotherapy;Moist Heat;Stair training;Gait training;Therapeutic exercise;Balance training;Patient/family education;Therapeutic activities;Functional mobility training;Neuromuscular re-education;Manual techniques;Passive range of motion;Dry needling;Scar mobilization;Spinal Manipulations;Joint Manipulations;Compression bandaging    PT Next Visit Plan  Pubic Symphysis MET and quick stretch    PT Home Exercise Plan  Gentle pelvic tilts and knees to chest stretch    Consulted and Agree with Plan of Care  Patient       Patient will benefit from skilled therapeutic intervention in order to improve the following deficits and impairments:  Abnormal gait, Decreased endurance, Decreased mobility, Difficulty walking, Increased muscle spasms, Pain, Postural dysfunction, Decreased strength, Decreased coordination, Decreased activity tolerance, Improper body mechanics, Decreased range of motion  Visit Diagnosis: Low back pain, unspecified back pain laterality, unspecified chronicity, unspecified  whether sciatica present  Abnormal posture  Other lack of coordination     Problem List Patient Active Problem List   Diagnosis Date Noted  . Normal labor 06/28/2018  . GBS (group B Streptococcus carrier), +RV culture, currently pregnant 06/17/2018  . Back pain affecting pregnancy 06/11/2018  . Supervision of high risk pregnancy, antepartum 11/20/2017  . Rh negative state in antepartum period 11/20/2017  . History of cholestasis during pregnancy 11/20/2017  . Depression affecting pregnancy 11/20/2017  . History of postpartum hemorrhage, currently pregnant 11/20/2017  . Multinodular goiter 07/02/2017  . Vitamin D deficiency 02/07/2017  . Postpartum care following vaginal delivery (11/22) 12/01/2015  . ADD (attention deficit disorder) 02/22/2015   Myles Gip PT, DPT [phone] 10/08/2018, 5:33 PM  [place] [facility] [address] [place], [place], [address] Phone: [phone]   Fax:  [phone]  Name: Annette Lawson MRN: [id number] Date of Birth: [birth year]

## 2018-10-16 ENCOUNTER — Encounter: Payer: Self-pay | Admitting: Physical Therapy

## 2018-10-16 ENCOUNTER — Other Ambulatory Visit: Payer: Self-pay

## 2018-10-16 ENCOUNTER — Ambulatory Visit: Payer: BC Managed Care – PPO | Attending: Obstetrics and Gynecology | Admitting: Physical Therapy

## 2018-10-16 DIAGNOSIS — R278 Other lack of coordination: Secondary | ICD-10-CM | POA: Insufficient documentation

## 2018-10-16 DIAGNOSIS — M545 Low back pain, unspecified: Secondary | ICD-10-CM

## 2018-10-16 DIAGNOSIS — R293 Abnormal posture: Secondary | ICD-10-CM | POA: Diagnosis present

## 2018-10-16 NOTE — Therapy (Signed)
[facility] [address]. [place], [place], [address] Phone: [phone]   Fax:  [phone]  Physical Therapy Treatment  Patient Details  Name: Annette Lawson MRN: [id number] Date of Birth: 1985/07/31 Referring Provider (PT): Prentice Docker, MD   Encounter Date: 10/16/2018  PT End of Session - 10/16/18 1637    Visit Number  12    Number of Visits  20    Date for PT Re-Evaluation  11/12/18    PT Start Time  5732    PT Stop Time  1640    PT Time Calculation (min)  55 min    Activity Tolerance  Patient tolerated treatment well    Behavior During Therapy  [facility] for tasks assessed/performed       Past Medical History:  Diagnosis Date  . ADD (attention deficit disorder)   . Cholestasis during pregnancy   . Depression   . Vaginal Pap smear, abnormal     Past Surgical History:  Procedure Laterality Date  . WISDOM TOOTH EXTRACTION      There were no vitals filed for this visit.  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 She notes this has been helpful. She also has found relief from muscle relaxants prior to sleeping. Patient has been doing hip hikes but has not found them to be super helpful yet. She notes that when she was walking today (about 20 min) and about 10 min in her R side started ot feel "warped."    Currently in Pain?  Yes    Pain Score  1     Pain Location  Back    Pain Descriptors / Indicators  Aching      TREATMENT  Pre-treatment assessment: R ASIS inferior and anterior  Manual Therapy: STM and TPR performed to R QL, gluteal complex and sacrotuberous and sacrospinous ligaments to allow for decreased tension and pain and improved posture and function R Sacral mobilizations for decreased spasm and improved mobility Thoracic CPA/UPAs T4-T8 multiple bouts, grade III/IV  Neuromuscular  Re-education: Standing B QL eccentric strengthening 2x5 each for improved postural control Controlled articular rotations of thoracic spine for improved joint mobility and proprioception, 30% MVC, B, x2 each direction Seated thoracic extension over ball for improved spinal segmentation and decreased spasm in mid-thoracic Reviewed HEP and strategies for incorporating HEP into daily routine for improved adherence.   Patient educated throughout session on appropriate technique and form using multi-modal cueing, HEP, and activity modification. Patient articulated understanding and returned demonstration.  Patient Response to interventions: Patient reports increased relief from thoracic extension  ASSESSMENT Patient presents to clinic with excellent motivation to participate in therapy. Patient demonstrates deficits in posture, pain, pubic alignment, pain free spinal and hip ROM, and coordination. Patient able to achieve improved thoracic spinal mobility during today's session and responded positively to manual interventions. Patient will benefit from continued skilled therapeutic intervention to address remaining deficits in posture, pain, pubic alignment, pain free spinal and hip ROM, and coordination in order to increase function, and improve overall QOL.    PT Long Term Goals - 09/17/18 1414      PT LONG TERM GOAL #1   Title  Pt will increase FOTO (mODI) to at least 79 points in order demonstrate clinically significant reduction in back pain/disability.    Baseline  IE: 65; 09/17/2018: 50  Time  8    Period  Weeks    Status  On-going    Target Date  11/12/18      PT LONG TERM GOAL #2   Title  Pt will decrease worst pain as reported on NPRS by at least 2 points in order to demonstrate clinically significant reduction in back pain.    Baseline  IE: 4/10; 09/17/2018: 4/10 after 12 hour shift and waking up on stomach    Time  8    Period  Weeks    Status  On-going    Target Date  11/12/18       PT LONG TERM GOAL #3   Title  Patient will demonstrate improved lumbar and hip ROM in isolation from pelvic complex and in absence of compensatory patterns and pain to indicate adequate ROM and function for return to participation in wellness activities.    Baseline  IE: unable and painful; 09/17/2018: better able to isolate hip ROM    Time  8    Period  Weeks    Status  On-going    Target Date  11/12/18      PT LONG TERM GOAL #4   Title  Patient will demonstrate maintained and level pubic symphysis alignment for 2 weeks with independent adherence to HEP in order to transition to self-management safely and effectively.    Baseline  IE: L pubic tubercle inferior and posterior shear; 09/17/2018: aligned and nonpainful    Time  8    Period  Weeks    Status  Achieved    Target Date  09/18/18      PT LONG TERM GOAL #5   Title  Patient will perform Sahrmann Abdominal rehabilitation protocol through phase 4 without need for VCs and TCs in order to demonstrate ability to stabilize posture with limb movements.    Baseline  09/17/2018: phase 1    Time  8    Period  Weeks    Status  New    Target Date  11/12/18            Plan - 10/16/18 1854    Clinical Impression Statement  Patient presents to clinic with excellent motivation to participate in therapy. Patient demonstrates deficits in posture, pain, pubic alignment, pain free spinal and hip ROM, and coordination. Patient able to achieve improved thoracic spinal mobility during today's session and responded positively to manual interventions. Patient will benefit from continued skilled therapeutic intervention to address remaining deficits in posture, pain, pubic alignment, pain free spinal and hip ROM, and coordination in order to increase function, and improve overall QOL.    Personal Factors and Comorbidities  Education;Sex;Past/Current Experience;Time since onset of injury/illness/exacerbation;Fitness;Profession;Behavior Pattern     Examination-Activity Limitations  Squat;Stairs;Locomotion Level;Stand;Caring for Others;Sit;Transfers;Sleep;Bed Mobility    Examination-Participation Restrictions  Driving;Meal Prep;Yard Work;Cleaning;Interpersonal Relationship;Laundry;Shop    Stability/Clinical Decision Making  Evolving/Moderate complexity    Rehab Potential  Good    PT Frequency  2x / week    PT Duration  8 weeks    PT Treatment/Interventions  ADLs/Self Care Home Management;Aquatic Therapy;Cryotherapy;Moist Heat;Stair training;Gait training;Therapeutic exercise;Balance training;Patient/family education;Therapeutic activities;Functional mobility training;Neuromuscular re-education;Manual techniques;Passive range of motion;Dry needling;Scar mobilization;Spinal Manipulations;Joint Manipulations;Compression bandaging    PT Next Visit Plan  Pubic Symphysis MET and quick stretch    PT Home Exercise Plan  Gentle pelvic tilts and knees to chest stretch    Consulted and Agree with Plan of Care  Patient       Patient will benefit  from skilled therapeutic intervention in order to improve the following deficits and impairments:  Abnormal gait, Decreased endurance, Decreased mobility, Difficulty walking, Increased muscle spasms, Pain, Postural dysfunction, Decreased strength, Decreased coordination, Decreased activity tolerance, Improper body mechanics, Decreased range of motion  Visit Diagnosis: Low back pain, unspecified back pain laterality, unspecified chronicity, unspecified whether sciatica present  Abnormal posture  Other lack of coordination     Problem List Patient Active Problem List   Diagnosis Date Noted  . Normal labor 06/28/2018  . GBS (group B Streptococcus carrier), +RV culture, currently pregnant 06/17/2018  . Back pain affecting pregnancy 06/11/2018  . Supervision of high risk pregnancy, antepartum 11/20/2017  . Rh negative state in antepartum period 11/20/2017  . History of cholestasis during pregnancy  11/20/2017  . Depression affecting pregnancy 11/20/2017  . History of postpartum hemorrhage, currently pregnant 11/20/2017  . Multinodular goiter 07/02/2017  . Vitamin D deficiency 02/07/2017  . Postpartum care following vaginal delivery (11/22) 12/01/2015  . ADD (attention deficit disorder) 02/22/2015   Myles Gip PT, DPT [phone] 10/16/2018, 7:01 PM  [facility] [address] [place], [place], [address] Phone: [phone]   Fax:  [phone]  Name: Annette Lawson MRN: [id number] Date of Birth: 1985-09-29

## 2018-10-24 ENCOUNTER — Other Ambulatory Visit: Payer: Self-pay

## 2018-10-24 ENCOUNTER — Encounter: Payer: Self-pay | Admitting: Physical Therapy

## 2018-10-24 ENCOUNTER — Ambulatory Visit: Payer: BC Managed Care – PPO | Admitting: Physical Therapy

## 2018-10-24 DIAGNOSIS — R278 Other lack of coordination: Secondary | ICD-10-CM

## 2018-10-24 DIAGNOSIS — R293 Abnormal posture: Secondary | ICD-10-CM

## 2018-10-24 DIAGNOSIS — M545 Low back pain, unspecified: Secondary | ICD-10-CM

## 2018-10-24 NOTE — Therapy (Signed)
[place] [facility] [address]. [place], [place], [address] Phone: [phone]   Fax:  [phone]  Physical Therapy Treatment  Patient Details  Name: Annette Lawson MRN: [id number] Date of Birth: December 26, 1985 Referring Provider (PT): Prentice Docker, MD   Encounter Date: 10/24/2018  PT End of Session - 10/24/18 1433    Visit Number  13    Number of Visits  20    Date for PT Re-Evaluation  11/12/18    PT Start Time  1430    PT Stop Time  1523    PT Time Calculation (min)  53 min    Activity Tolerance  Patient tolerated treatment well    Behavior During Therapy  [facility] for tasks assessed/performed       Past Medical History:  Diagnosis Date  . ADD (attention deficit disorder)   . Cholestasis during pregnancy   . Depression   . Vaginal Pap smear, abnormal     Past Surgical History:  Procedure Laterality Date  . WISDOM TOOTH EXTRACTION      There were no vitals filed for this visit.  Subjective Assessment - 10/24/18 1430    Subjective  Patient notes that she has ahd a few days off because she worked the weekend and had some pain on those days. She presents with a mildly antalgic gait. Patient notes that her infant is starting to teeth and is interested in standing a lot and she is having to manage that live weight. She notes that this involves a lot of up and down movements which may be why her back is bothering her today.    Currently in Pain?  Yes    Pain Score  3     Pain Location  Back    Pain Orientation  Right;Medial    Pain Descriptors / Indicators  Aching       TREATMENT  Pre-treatment assessment: R ASIS anterior  Manual Therapy: STM and TPR performed to R QL, gluteal complex and sacrotuberous and sacrospinous ligaments to allow for decreased tension and pain and improved posture and function R Sacral mobilizations for decreased spasm and improved mobility  Neuromuscular Re-education: Sidelying R QL eccentric  strengthening multiple bouts for improved postural control Sidelying R posterior pelvic tilt for decreased spasm in and around the sacrum x10 Body mechanics education for pushing tasks (work-related and vacuuming) with heavy emphasis placed on considering keeping the load close to center and close to body. Patient articulating understanding and demonstrating good corrections of levers during practice. Standing Pilates Postural Control Reverse Serve a Tray, BTB, x10 Chest expansion, BTB, x10 Advanced OH Anti-extension, BTB, x10 Standing R hip flexor stretch/lunge for improved pelvic alignment and postural awareness 3x2 min   Patient educated throughout session on appropriate technique and form using multi-modal cueing, HEP, and activity modification. Patient articulated understanding and returned demonstration.  Patient Response to interventions: Patient reports improved mobility of back and denies increase in pain.  ASSESSMENT Patient presents to clinic with excellent motivation to participate in therapy. Patient demonstrates deficits in posture, pain, pubic alignment, pain free spinal and hip ROM, and coordination. Patient able to demonstrate appropriate body mechanics for vacuuming during today's session and responded positively to manual interventions. Patient will benefit from continued skilled therapeutic intervention to address remaining deficits in posture, pain, pubic alignment, pain free spinal and hip ROM, and coordination in order to increase function, and improve overall QOL.    PT Long Term Goals - 09/17/18 1414  PT LONG TERM GOAL #1   Title  Pt will increase FOTO (mODI) to at least 79 points in order demonstrate clinically significant reduction in back pain/disability.    Baseline  IE: 65; 09/17/2018: 50    Time  8    Period  Weeks    Status  On-going    Target Date  11/12/18      PT LONG TERM GOAL #2   Title  Pt will decrease worst pain as reported on NPRS by at least 2  points in order to demonstrate clinically significant reduction in back pain.    Baseline  IE: 4/10; 09/17/2018: 4/10 after 12 hour shift and waking up on stomach    Time  8    Period  Weeks    Status  On-going    Target Date  11/12/18      PT LONG TERM GOAL #3   Title  Patient will demonstrate improved lumbar and hip ROM in isolation from pelvic complex and in absence of compensatory patterns and pain to indicate adequate ROM and function for return to participation in wellness activities.    Baseline  IE: unable and painful; 09/17/2018: better able to isolate hip ROM    Time  8    Period  Weeks    Status  On-going    Target Date  11/12/18      PT LONG TERM GOAL #4   Title  Patient will demonstrate maintained and level pubic symphysis alignment for 2 weeks with independent adherence to HEP in order to transition to self-management safely and effectively.    Baseline  IE: L pubic tubercle inferior and posterior shear; 09/17/2018: aligned and nonpainful    Time  8    Period  Weeks    Status  Achieved    Target Date  09/18/18      PT LONG TERM GOAL #5   Title  Patient will perform Sahrmann Abdominal rehabilitation protocol through phase 4 without need for VCs and TCs in order to demonstrate ability to stabilize posture with limb movements.    Baseline  09/17/2018: phase 1    Time  8    Period  Weeks    Status  New    Target Date  11/12/18            Plan - 10/24/18 1640    Clinical Impression Statement  Patient presents to clinic with excellent motivation to participate in therapy. Patient demonstrates deficits in posture, pain, pubic alignment, pain free spinal and hip ROM, and coordination. Patient able to demonstrate appropriate body mechanics for vacuuming during today's session and responded positively to manual interventions. Patient will benefit from continued skilled therapeutic intervention to address remaining deficits in posture, pain, pubic alignment, pain free spinal and hip  ROM, and coordination in order to increase function, and improve overall QOL.    Personal Factors and Comorbidities  Education;Sex;Past/Current Experience;Time since onset of injury/illness/exacerbation;Fitness;Profession;Behavior Pattern    Examination-Activity Limitations  Squat;Stairs;Locomotion Level;Stand;Caring for Others;Sit;Transfers;Sleep;Bed Mobility    Examination-Participation Restrictions  Driving;Meal Prep;Yard Work;Cleaning;Interpersonal Relationship;Laundry;Shop    Stability/Clinical Decision Making  Evolving/Moderate complexity    Rehab Potential  Good    PT Frequency  2x / week    PT Duration  8 weeks    PT Treatment/Interventions  ADLs/Self Care Home Management;Aquatic Therapy;Cryotherapy;Moist Heat;Stair training;Gait training;Therapeutic exercise;Balance training;Patient/family education;Therapeutic activities;Functional mobility training;Neuromuscular re-education;Manual techniques;Passive range of motion;Dry needling;Scar mobilization;Spinal Manipulations;Joint Manipulations;Compression bandaging    PT Next Visit Plan  Pubic Symphysis  MET and quick stretch    PT Home Exercise Plan  Gentle pelvic tilts and knees to chest stretch    Consulted and Agree with Plan of Care  Patient       Patient will benefit from skilled therapeutic intervention in order to improve the following deficits and impairments:  Abnormal gait, Decreased endurance, Decreased mobility, Difficulty walking, Increased muscle spasms, Pain, Postural dysfunction, Decreased strength, Decreased coordination, Decreased activity tolerance, Improper body mechanics, Decreased range of motion  Visit Diagnosis: Low back pain, unspecified back pain laterality, unspecified chronicity, unspecified whether sciatica present  Abnormal posture  Other lack of coordination     Problem List Patient Active Problem List   Diagnosis Date Noted  . Normal labor 06/28/2018  . GBS (group B Streptococcus carrier), +RV  culture, currently pregnant 06/17/2018  . Back pain affecting pregnancy 06/11/2018  . Supervision of high risk pregnancy, antepartum 11/20/2017  . Rh negative state in antepartum period 11/20/2017  . History of cholestasis during pregnancy 11/20/2017  . Depression affecting pregnancy 11/20/2017  . History of postpartum hemorrhage, currently pregnant 11/20/2017  . Multinodular goiter 07/02/2017  . Vitamin D deficiency 02/07/2017  . Postpartum care following vaginal delivery (11/22) 12/01/2015  . ADD (attention deficit disorder) 02/22/2015   Myles Gip PT, DPT [phone] 10/24/2018, 4:50 PM  [facility] [address] [place], [place], [address] Phone: [phone]   Fax:  [phone]  Name: Annette Lawson MRN: [id number] Date of Birth: 1985/08/18

## 2018-10-31 ENCOUNTER — Encounter: Payer: BC Managed Care – PPO | Admitting: Physical Therapy

## 2019-03-24 ENCOUNTER — Encounter: Payer: Self-pay | Admitting: Nurse Practitioner

## 2019-04-18 ENCOUNTER — Telehealth: Payer: Self-pay

## 2019-04-18 NOTE — Telephone Encounter (Signed)
Confirmed and screened for 04-22-19 ov.

## 2019-04-22 ENCOUNTER — Other Ambulatory Visit: Payer: Self-pay | Admitting: Nurse Practitioner

## 2019-04-22 ENCOUNTER — Other Ambulatory Visit: Payer: Self-pay

## 2019-04-22 ENCOUNTER — Encounter: Payer: Self-pay | Admitting: Nurse Practitioner

## 2019-04-22 ENCOUNTER — Ambulatory Visit: Payer: BC Managed Care – PPO | Admitting: Nurse Practitioner

## 2019-04-22 VITALS — BP 134/79 | HR 89 | Temp 97.8°F | Resp 16 | Ht 67.0 in | Wt 178.8 lb

## 2019-04-22 DIAGNOSIS — I7300 Raynaud's syndrome without gangrene: Secondary | ICD-10-CM

## 2019-04-22 DIAGNOSIS — E010 Iodine-deficiency related diffuse (endemic) goiter: Secondary | ICD-10-CM | POA: Diagnosis not present

## 2019-04-22 DIAGNOSIS — R5383 Other fatigue: Secondary | ICD-10-CM

## 2019-04-22 NOTE — Progress Notes (Signed)
[facility] [place], [place] [address]  Internal MEDICINE  Office Visit Note  Patient Name: Annette Lawson  [id number]  [id number]  Date of Service: 05/03/2019  Chief Complaint  Patient presents with  . Thyroid Problem    irregular every time it is checked and would like to recheck , sometimes feels like there is a lump in her throat, thyroid gets swollen   . Menstrual Problem    pms has been severe, neck and muscle pain, sickly for a week   . Low Temperature    when temp gets 96.9 neck and shoulder pain, fatigue   . Diarrhea    2-3 times every morning   . Skin Problem    moddled skin all throughout   . Hair/Scalp Problem    lots of hair fall  . Weight Management    trouble losing weight   . mood swings    irritability, face gets flushed around nosed and cheeks     The patient is here for follow up visit. She states that for the past three months, she has been getting severe premenstrual symptoms. She feels like she has the flu. She gets body aches, abdominal pain and nausea, along with lower body temperature. Once her period starts, symptoms go away. She has also noted that her hair is falling out and has skin mottling of the arms and legs. This is not response to heat or cold. She states that initially, symptoms started after having her last baby, which was 06/2018. They have gradually become worse, especially these last few months. She does not wish to take oral contraceptives. Did try micronor while breast feeding her last baby, but she states this made her feel much worse.       Current Medication: Outpatient Encounter Medications as of 04/22/2019  Medication Sig Note  . acetaminophen (TYLENOL) 500 MG tablet Take 500 mg by mouth every 6 (six) hours as needed for mild pain or headache.   . amphetamine-dextroamphetamine (ADDERALL) 15 MG tablet Take 10 mg by mouth in the morning, at noon, and at bedtime.    . cetirizine (ZYRTEC) 10 MG tablet Take 10  mg by mouth daily.   . cyclobenzaprine (FLEXERIL) 10 MG tablet Take 1 tablet (10 mg total) by mouth every 8 (eight) hours as needed for muscle spasms. 08/14/2018: As needed   . metaxalone (SKELAXIN) 800 MG tablet Take 1 tablet (800 mg total) by mouth 3 (three) times daily as needed for muscle spasms.   . Prenatal Vit-Fe Fumarate-FA (PRENATAL MULTIVITAMIN) TABS tablet Take 1 tablet by mouth daily at 12 noon.   . [DISCONTINUED] norethindrone (MICRONOR) 0.35 MG tablet Take 1 tablet (0.35 mg total) by mouth daily. (Patient not taking: Reported on 04/22/2019)   . [DISCONTINUED] oxyCODONE-acetaminophen (PERCOCET) 5-325 MG tablet Take 1 tablet by mouth every 12 (twelve) hours as needed for severe pain. (Patient not taking: Reported on 04/22/2019)    No facility-administered encounter medications on file as of 04/22/2019.    Surgical History: Past Surgical History:  Procedure Laterality Date  . WISDOM TOOTH EXTRACTION      Medical History: Past Medical History:  Diagnosis Date  . ADD (attention deficit disorder)   . Cholestasis during pregnancy   . Depression   . Vaginal Pap smear, abnormal     Family History: Family History  Problem Relation Age of Onset  . Hypothyroidism Mother   . COPD Maternal Grandmother        reactive airway  disease    Social History   Socioeconomic History  . Marital status: Married    Spouse name: Not on file  . Number of children: Not on file  . Years of education: Not on file  . Highest education level: Not on file  Occupational History  . Not on file  Tobacco Use  . Smoking status: Current Some Day Smoker    Types: Cigarettes  . Smokeless tobacco: Never Used  Substance and Sexual Activity  . Alcohol use: Yes    Alcohol/week: 0.0 standard drinks    Comment: Occasional  . Drug use: No  . Sexual activity: Yes    Birth control/protection: None  Other Topics Concern  . Not on file  Social History Narrative  . Not on file   Social Determinants of  Health   Financial Resource Strain:   . Difficulty of Paying Living Expenses:   Food Insecurity:   . Worried About Charity fundraiser in the Last Year:   . Arboriculturist in the Last Year:   Transportation Needs:   . Film/video editor (Medical):   Marland Kitchen Lack of Transportation (Non-Medical):   Physical Activity:   . Days of Exercise per Week:   . Minutes of Exercise per Session:   Stress:   . Feeling of Stress :   Social Connections:   . Frequency of Communication with Friends and Family:   . Frequency of Social Gatherings with Friends and Family:   . Attends Religious Services:   . Active Member of Clubs or Organizations:   . Attends Archivist Meetings:   Marland Kitchen Marital Status:   Intimate Partner Violence:   . Fear of Current or Ex-Partner:   . Emotionally Abused:   Marland Kitchen Physically Abused:   . Sexually Abused:       Review of Systems  Constitutional: Positive for fatigue. Negative for activity change, chills and unexpected weight change.  HENT: Negative for congestion, postnasal drip, rhinorrhea, sneezing and sore throat.   Respiratory: Negative for cough, chest tightness, shortness of breath and wheezing.   Cardiovascular: Negative for chest pain and palpitations.  Gastrointestinal: Negative for abdominal pain, constipation, diarrhea, nausea and vomiting.  Endocrine: Negative for cold intolerance, heat intolerance, polydipsia and polyuria.       Enlarged thyroid  Musculoskeletal: Negative for arthralgias, back pain, joint swelling and neck pain.  Skin: Positive for color change and pallor. Negative for rash.  Allergic/Immunologic: Negative for environmental allergies.  Neurological: Negative for dizziness, tremors, numbness and headaches.  Hematological: Negative for adenopathy. Does not bruise/bleed easily.  Psychiatric/Behavioral: Negative for behavioral problems (Depression), sleep disturbance and suicidal ideas. The patient is not nervous/anxious.     Today's  Vitals   04/22/19 1134  BP: 134/79  Pulse: 89  Resp: 16  Temp: 97.8 F (36.6 C)  SpO2: 99%  Weight: 178 lb 12.8 oz (81.1 kg)  Height: 5\' 7"  (1.702 m)   Body mass index is 28 kg/m.  Physical Exam Vitals and nursing note reviewed.  Constitutional:      General: She is not in acute distress.    Appearance: Normal appearance. She is well-developed. She is not diaphoretic.  HENT:     Head: Normocephalic and atraumatic.     Nose: Nose normal.     Mouth/Throat:     Pharynx: No oropharyngeal exudate.  Eyes:     Pupils: Pupils are equal, round, and reactive to light.  Neck:     Thyroid: Thyromegaly  present.     Vascular: No JVD.     Trachea: No tracheal deviation.  Cardiovascular:     Rate and Rhythm: Normal rate and regular rhythm.     Heart sounds: Normal heart sounds. No murmur. No friction rub. No gallop.   Pulmonary:     Effort: Pulmonary effort is normal. No respiratory distress.     Breath sounds: Normal breath sounds. No wheezing or rales.  Chest:     Chest wall: No tenderness.  Abdominal:     General: Bowel sounds are normal.     Palpations: Abdomen is soft.     Tenderness: There is no abdominal tenderness.  Musculoskeletal:        General: Normal range of motion.     Cervical back: Normal range of motion and neck supple.  Lymphadenopathy:     Cervical: No cervical adenopathy.  Skin:    General: Skin is warm and dry.  Neurological:     Mental Status: She is alert and oriented to person, place, and time.     Cranial Nerves: No cranial nerve deficit.  Psychiatric:        Mood and Affect: Mood normal.        Behavior: Behavior normal.        Thought Content: Thought content normal.        Judgment: Judgment normal.   Assessment/Plan: 1. Thyromegaly Will get ultrasound of the thyroid for further evaluation.  - US Soft Tissue Head/Neck (NON-THYROID); Future  2. Raynaud's phenomenon without gangrene Check labs, including ANA with reflex, RA, and sed rate.  Will also check full thyroid panel for further evaluation.   3. Other fatigue ,abs to be checked and thyroid ultrasound ordered for further evaluation.   General Counseling: Chaquetta verbalizes understanding of the findings of todays visit and agrees with plan of treatment. I have discussed any further diagnostic evaluation that may be needed or ordered today. We also reviewed her medications today. she has been encouraged to call the office with any questions or concerns that should arise related to todays visit.    Orders Placed This Encounter  Procedures  . US Soft Tissue Head/Neck (NON-THYROID)    This patient was seen by Leretha Pol FNP Collaboration with Dr Lavera Guise as a part of collaborative care agreement  Total time spent: 30 Minutes   Time spent includes review of chart, medications, test results, and follow up plan with the patient.      Dr Lavera Guise Internal medicine

## 2019-04-23 LAB — BASIC METABOLIC PANEL
BUN/Creatinine Ratio: 11 (ref 9–23)
BUN: 8 mg/dL (ref 6–20)
CO2: 26 mmol/L (ref 20–29)
Calcium: 9.8 mg/dL (ref 8.7–10.2)
Chloride: 100 mmol/L (ref 96–106)
Creatinine, Ser: 0.7 mg/dL (ref 0.57–1.00)
GFR calc Af Amer: 133 mL/min/{1.73_m2} (ref >59)
GFR calc non Af Amer: 115 mL/min/{1.73_m2} (ref >59)
Glucose: 80 mg/dL (ref 65–99)
Potassium: 3.9 mmol/L (ref 3.5–5.2)
Sodium: 140 mmol/L (ref 134–144)

## 2019-04-23 LAB — CBC
Hematocrit: 42.5 % (ref 34.0–46.6)
Hemoglobin: 14.5 g/dL (ref 11.1–15.9)
MCH: 31.5 pg (ref 26.6–33.0)
MCHC: 34.1 g/dL (ref 31.5–35.7)
MCV: 92 fL (ref 79–97)
Platelets: 340 10*3/uL (ref 150–450)
RBC: 4.61 x10E6/uL (ref 3.77–5.28)
RDW: 12.5 % (ref 11.7–15.4)
WBC: 7.2 10*3/uL (ref 3.4–10.8)

## 2019-04-23 LAB — TSH: TSH: 0.565 u[IU]/mL (ref 0.450–4.500)

## 2019-04-23 LAB — IRON AND TIBC
Iron Saturation: 28 % (ref 15–55)
Iron: 102 ug/dL (ref 27–159)
Total Iron Binding Capacity: 359 ug/dL (ref 250–450)
UIBC: 257 ug/dL (ref 131–425)

## 2019-04-23 LAB — SEDIMENTATION RATE: Sed Rate: 4 mm/hr (ref 0–32)

## 2019-04-23 LAB — ANA W/REFLEX IF POSITIVE: Anti Nuclear Antibody (ANA): NEGATIVE

## 2019-04-23 LAB — HGB A1C W/O EAG: Hgb A1c MFr Bld: 5 % (ref 4.8–5.6)

## 2019-04-23 LAB — B12 AND FOLATE PANEL
Folate: 18.9 ng/mL (ref >3.0)
Vitamin B-12: 363 pg/mL (ref 232–1245)

## 2019-04-23 LAB — T3: T3, Total: 105 ng/dL (ref 71–180)

## 2019-04-23 LAB — T4, FREE: Free T4: 1.2 ng/dL (ref 0.82–1.77)

## 2019-04-23 LAB — RHEUMATOID FACTOR: Rhuematoid fact SerPl-aCnc: <10 IU/mL (ref 0.0–13.9)

## 2019-04-23 LAB — FERRITIN: Ferritin: 31 ng/mL (ref 15–150)

## 2019-04-23 LAB — VITAMIN D 25 HYDROXY (VIT D DEFICIENCY, FRACTURES): Vit D, 25-Hydroxy: 29.5 ng/mL — ABNORMAL LOW (ref 30.0–100.0)

## 2019-04-23 LAB — PROLACTIN: Prolactin: 6.2 ng/mL (ref 4.8–23.3)

## 2019-04-23 LAB — THYROGLOBULIN ANTIBODY: Thyroglobulin Antibody: <1 IU/mL (ref 0.0–0.9)

## 2019-04-23 LAB — THYROID PEROXIDASE ANTIBODY: Thyroperoxidase Ab SerPl-aCnc: <9 IU/mL (ref 0–34)

## 2019-04-23 LAB — SPECIMEN STATUS REPORT

## 2019-04-24 NOTE — Progress Notes (Signed)
Labs good. Discuss at visit 05/27/2019

## 2019-05-03 ENCOUNTER — Encounter: Payer: Self-pay | Admitting: Nurse Practitioner

## 2019-05-03 DIAGNOSIS — I7300 Raynaud's syndrome without gangrene: Secondary | ICD-10-CM | POA: Insufficient documentation

## 2019-05-03 DIAGNOSIS — R5383 Other fatigue: Secondary | ICD-10-CM | POA: Insufficient documentation

## 2019-05-03 DIAGNOSIS — E010 Iodine-deficiency related diffuse (endemic) goiter: Secondary | ICD-10-CM | POA: Insufficient documentation

## 2019-05-04 ENCOUNTER — Other Ambulatory Visit: Payer: Self-pay | Admitting: Nurse Practitioner

## 2019-05-04 DIAGNOSIS — Z30011 Encounter for initial prescription of contraceptive pills: Secondary | ICD-10-CM

## 2019-05-04 MED ORDER — DROSPIRENONE-ETHINYL ESTRADIOL 3-0.02 MG PO TABS: 1.0000 | ORAL_TABLET | Freq: Every day | ORAL | 11 refills | Status: DC

## 2019-05-04 NOTE — Progress Notes (Signed)
Start yaz. Take daily. New prescription sent to [facility].

## 2019-05-16 ENCOUNTER — Other Ambulatory Visit: Payer: BC Managed Care – PPO

## 2019-05-22 ENCOUNTER — Telehealth: Payer: Self-pay

## 2019-05-22 NOTE — Telephone Encounter (Signed)
Confirmed and screened for 05-27-19 ov.

## 2019-05-27 ENCOUNTER — Ambulatory Visit: Payer: BC Managed Care – PPO | Admitting: Nurse Practitioner

## 2019-05-27 ENCOUNTER — Encounter: Payer: Self-pay | Admitting: Nurse Practitioner

## 2019-05-27 ENCOUNTER — Other Ambulatory Visit: Payer: Self-pay

## 2019-05-27 VITALS — BP 122/79 | HR 77 | Temp 97.4°F | Resp 16 | Ht 67.0 in | Wt 178.0 lb

## 2019-05-27 DIAGNOSIS — I7300 Raynaud's syndrome without gangrene: Secondary | ICD-10-CM | POA: Diagnosis not present

## 2019-05-27 DIAGNOSIS — D485 Neoplasm of uncertain behavior of skin: Secondary | ICD-10-CM | POA: Diagnosis not present

## 2019-05-27 DIAGNOSIS — F3281 Premenstrual dysphoric disorder: Secondary | ICD-10-CM

## 2019-05-27 DIAGNOSIS — E010 Iodine-deficiency related diffuse (endemic) goiter: Secondary | ICD-10-CM

## 2019-05-27 NOTE — Progress Notes (Signed)
[facility] [place],  [address]  Internal MEDICINE  Office Visit Note  Patient Name: Annette Lawson  [id number]  [id number]  Date of Service: 06/01/2019  Chief Complaint  Patient presents with  . Follow-up    review labs   . Medication Management    a week or two after starting birth control pt was irritable and stopped taking medication, started back this sunday   . Breast Problem    in between breasts looks like its blackhead     The patient is here for follow up visit. She had been having she has been getting severe premenstrual symptoms. She feels like she has the flu. She gets body aches, abdominal pain and nausea, along with lower body temperature. Once her period starts, symptoms go away. She has also noted that her hair is falling out and has skin mottling of the arms and legs. This is not response to heat or cold. She states that initially, symptoms started after having her last baby, which was 06/2018. They have gradually become worse, especially these last few months. She had extensive labs done since her last visit. She had very mild reduction of vitamin d, but all other labs were normal. She was to have thyroid ultrasound as well, but has not had this done as this does not correlate with her schedule.  Also tried oral contraceptive pill. First time she tried, caused her to have severe mood swings and very irritable. She stopped. This month, she started OCP again three days ago. This is week leading up to ovulation for her. We discussed that, if symptoms do not resolve with this medication, I will refer her to GYN for further evaluation.  Has "blackhead" type symptom on the middle of right breast. She has been able to squeeze some fluid out of it in the past. After inflammation goes away, the black spot remains and it becomes inflamed again and filled with fluid. This has been present for at least two months.       Current Medication: Outpatient  Encounter Medications as of 05/27/2019  Medication Sig Note  . acetaminophen (TYLENOL) 500 MG tablet Take 500 mg by mouth every 6 (six) hours as needed for mild pain or headache.   . amphetamine-dextroamphetamine (ADDERALL) 15 MG tablet Take 10 mg by mouth in the morning, at noon, and at bedtime.    . cetirizine (ZYRTEC) 10 MG tablet Take 10 mg by mouth daily.   . cyclobenzaprine (FLEXERIL) 10 MG tablet Take 1 tablet (10 mg total) by mouth every 8 (eight) hours as needed for muscle spasms. 08/14/2018: As needed   . drospirenone-ethinyl estradiol (YAZ) 3-0.02 MG tablet Take 1 tablet by mouth daily.   . metaxalone (SKELAXIN) 800 MG tablet Take 1 tablet (800 mg total) by mouth 3 (three) times daily as needed for muscle spasms.   . Prenatal Vit-Fe Fumarate-FA (PRENATAL MULTIVITAMIN) TABS tablet Take 1 tablet by mouth daily at 12 noon.    No facility-administered encounter medications on file as of 05/27/2019.    Surgical History: Past Surgical History:  Procedure Laterality Date  . WISDOM TOOTH EXTRACTION      Medical History: Past Medical History:  Diagnosis Date  . ADD (attention deficit disorder)   . Cholestasis during pregnancy   . Depression   . Vaginal Pap smear, abnormal     Family History: Family History  Problem Relation Age of Onset  . Hypothyroidism Mother   . COPD Maternal Grandmother  reactive airway disease    Social History   Socioeconomic History  . Marital status: Married    Spouse name: Not on file  . Number of children: Not on file  . Years of education: Not on file  . Highest education level: Not on file  Occupational History  . Not on file  Tobacco Use  . Smoking status: Current Some Day Smoker    Types: Cigarettes  . Smokeless tobacco: Never Used  Substance and Sexual Activity  . Alcohol use: Yes    Alcohol/week: 0.0 standard drinks    Comment: Occasional  . Drug use: No  . Sexual activity: Yes    Birth control/protection: None  Other  Topics Concern  . Not on file  Social History Narrative  . Not on file   Social Determinants of Health   Financial Resource Strain:   . Difficulty of Paying Living Expenses:   Food Insecurity:   . Worried About Charity fundraiser in the Last Year:   . Arboriculturist in the Last Year:   Transportation Needs:   . Film/video editor (Medical):   Marland Kitchen Lack of Transportation (Non-Medical):   Physical Activity:   . Days of Exercise per Week:   . Minutes of Exercise per Session:   Stress:   . Feeling of Stress :   Social Connections:   . Frequency of Communication with Friends and Family:   . Frequency of Social Gatherings with Friends and Family:   . Attends Religious Services:   . Active Member of Clubs or Organizations:   . Attends Archivist Meetings:   Marland Kitchen Marital Status:   Intimate Partner Violence:   . Fear of Current or Ex-Partner:   . Emotionally Abused:   Marland Kitchen Physically Abused:   . Sexually Abused:       Review of Systems  Constitutional: Positive for fatigue. Negative for activity change, chills and unexpected weight change.  HENT: Negative for congestion, postnasal drip, rhinorrhea, sneezing and sore throat.   Respiratory: Negative for cough, chest tightness, shortness of breath and wheezing.   Cardiovascular: Negative for chest pain and palpitations.  Gastrointestinal: Negative for abdominal pain, constipation, diarrhea, nausea and vomiting.  Endocrine: Negative for cold intolerance, heat intolerance, polydipsia and polyuria.       Enlarged thyroid  Musculoskeletal: Negative for arthralgias, back pain, joint swelling and neck pain.  Skin: Positive for color change and pallor. Negative for rash.       Dark colored, mole-like lesion on inner aspect of right breast. It is not tender.   Allergic/Immunologic: Negative for environmental allergies.  Neurological: Negative for dizziness, tremors, numbness and headaches.  Hematological: Negative for adenopathy.  Does not bruise/bleed easily.  Psychiatric/Behavioral: Negative for behavioral problems (Depression), sleep disturbance and suicidal ideas. The patient is nervous/anxious.    Today's Vitals   05/27/19 1447  BP: 122/79  Pulse: 77  Resp: 16  Temp: (!) 97.4 F (36.3 C)  SpO2: 100%  Weight: 178 lb (80.7 kg)  Height: 5\' 7"  (1.702 m)   Body mass index is 27.88 kg/m.  Physical Exam Vitals and nursing note reviewed.  Constitutional:      General: She is not in acute distress.    Appearance: Normal appearance. She is well-developed. She is not diaphoretic.  HENT:     Head: Normocephalic and atraumatic.     Nose: Nose normal.     Mouth/Throat:     Pharynx: No oropharyngeal exudate.  Eyes:  Pupils: Pupils are equal, round, and reactive to light.  Neck:     Thyroid: Thyromegaly present.     Vascular: No JVD.     Trachea: No tracheal deviation.  Cardiovascular:     Rate and Rhythm: Normal rate and regular rhythm.     Heart sounds: Normal heart sounds. No murmur. No friction rub. No gallop.   Pulmonary:     Effort: Pulmonary effort is normal. No respiratory distress.     Breath sounds: Normal breath sounds. No wheezing or rales.  Chest:     Chest wall: No tenderness.  Abdominal:     Palpations: Abdomen is soft.  Musculoskeletal:        General: Normal range of motion.     Cervical back: Normal range of motion and neck supple.  Lymphadenopathy:     Cervical: No cervical adenopathy.  Skin:    General: Skin is warm and dry.       Neurological:     Mental Status: She is alert and oriented to person, place, and time.     Cranial Nerves: No cranial nerve deficit.  Psychiatric:        Mood and Affect: Mood normal.        Behavior: Behavior normal.        Thought Content: Thought content normal.        Judgment: Judgment normal.   Assessment/Plan: 1. Raynaud's phenomenon without gangrene Reviewed labs with patient. All normal. No worsening of symptoms. They are associated  with menstrual cycle. I have started oral contraceptive pills, which she has recently started. If no improvement over next few months, will refer her to GYN provider.   2. Thyromegaly Thyroid ultrasound ordered. Will discuss with patient after test performed.   3. Premenstrual dysphoric disorder I have started oral contraceptive pills, which she has recently started. If no improvement over next few months, will refer her to GYN provider.   4. 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  - Ambulatory referral to Dermatology   General Counseling: Jakiah verbalizes understanding of the findings of todays visit and agrees with plan of treatment. I have discussed any further diagnostic evaluation that may be needed or ordered today. We also reviewed her medications today. she has been encouraged to call the office with any questions or concerns that should arise related to todays visit.  This patient was seen by Leretha Pol FNP Collaboration with Dr Lavera Guise as a part of collaborative care agreement  Orders Placed This Encounter  Procedures  . Ambulatory referral to Dermatology      Total time spent: 30 Minutes   Time spent includes review of chart, medications, test results, and follow up plan with the patient.      Dr Lavera Guise Internal medicine

## 2019-06-01 ENCOUNTER — Encounter: Payer: Self-pay | Admitting: Nurse Practitioner

## 2019-06-01 DIAGNOSIS — D485 Neoplasm of uncertain behavior of skin: Secondary | ICD-10-CM | POA: Insufficient documentation

## 2019-06-01 DIAGNOSIS — F3281 Premenstrual dysphoric disorder: Secondary | ICD-10-CM | POA: Insufficient documentation

## 2019-06-08 ENCOUNTER — Encounter: Payer: Self-pay | Admitting: Nurse Practitioner

## 2019-06-09 ENCOUNTER — Other Ambulatory Visit: Payer: Self-pay

## 2019-06-09 ENCOUNTER — Ambulatory Visit
Admission: RE | Admit: 2019-06-09 | Discharge: 2019-06-09 | Disposition: A | Discharge status: Home / Self Care | Payer: BC Managed Care – PPO | Source: Ambulatory Visit | Attending: Family Medicine | Admitting: Family Medicine

## 2019-06-09 ENCOUNTER — Ambulatory Visit
Admission: EM | Admit: 2019-06-09 | Discharge: 2019-06-09 | Disposition: A | Discharge status: Home / Self Care | Payer: BC Managed Care – PPO | Attending: Family Medicine | Admitting: Family Medicine

## 2019-06-09 DIAGNOSIS — M79661 Pain in right lower leg: Secondary | ICD-10-CM | POA: Diagnosis not present

## 2019-06-09 DIAGNOSIS — M7989 Other specified soft tissue disorders: Secondary | ICD-10-CM | POA: Insufficient documentation

## 2019-06-09 DIAGNOSIS — M79604 Pain in right leg: Secondary | ICD-10-CM | POA: Diagnosis not present

## 2019-06-09 MED ORDER — HYDROCODONE-ACETAMINOPHEN 5-325 MG PO TABS: ORAL_TABLET | ORAL | 0 refills | Status: DC

## 2019-06-09 NOTE — ED Triage Notes (Addendum)
Pain and minor swelling to inner right thigh and knee x 1 week, patient just completed 1st month of birth control, concerned for DVT

## 2019-06-09 NOTE — Discharge Instructions (Addendum)
Ultrasound at [facility]

## 2019-06-09 NOTE — ED Notes (Signed)
Spoke with Annette Lawson at [facility] [place]. [facility] to [facility]in. Patient advised.

## 2019-06-09 NOTE — ED Provider Notes (Signed)
[facility]    CSN: [id number] Arrival date & time: 06/09/19  0810      History   Chief Complaint Chief Complaint  Patient presents with  . Leg Pain    HPI Annette Lawson is a 34 y.o. female.   34 yo female with a c/o pain and swelling to right inner thigh, knee and calf area for one week. States pain feels "like it's burning". Denies any falls or other injuries, fevers, chills, rash, numbness/tingling, weakness, chest pains, shortness of breath. Patient is on oral birth control and is concerned for DVT.    Leg Pain   Past Medical History:  Diagnosis Date  . ADD (attention deficit disorder)   . Cholestasis during pregnancy   . Depression   . Vaginal Pap smear, abnormal     Patient Active Problem List   Diagnosis Date Noted  . Premenstrual dysphoric disorder 06/01/2019  . 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 Thyromegaly 05/03/2019  . Raynaud's phenomenon without gangrene 05/03/2019  . Other fatigue 05/03/2019  . Normal labor 06/28/2018  . GBS (group B Streptococcus carrier), +RV culture, currently pregnant 06/17/2018  . Back pain affecting pregnancy 06/11/2018  . Supervision of high risk pregnancy, antepartum 11/20/2017  . Rh negative state in antepartum period 11/20/2017  . History of cholestasis during pregnancy 11/20/2017  . Depression affecting pregnancy 11/20/2017  . History of postpartum hemorrhage, currently pregnant 11/20/2017  . Multinodular goiter 07/02/2017  . Vitamin D deficiency 02/07/2017  . Postpartum care following vaginal delivery (11/22) 12/01/2015  . ADD (attention deficit disorder) 02/22/2015    Past Surgical History:  Procedure Laterality Date  . WISDOM TOOTH EXTRACTION      OB History    Gravida  2   Para  2   Term  2   Preterm      AB      Living  2     SAB      TAB      Ectopic      Multiple  0   Live Births  2            Home Medications    Prior to Admission  medications   Medication Sig Start Date End Date Taking? Authorizing Provider  acetaminophen (TYLENOL) 500 MG tablet Take 500 mg by mouth every 6 (six) hours as needed for mild pain or headache.    [provider]  amphetamine-dextroamphetamine (ADDERALL) 15 MG tablet Take 15 mg by mouth in the morning, at noon, and at bedtime.     [provider]  cetirizine (ZYRTEC) 10 MG tablet Take 10 mg by mouth daily.    [provider]  cyclobenzaprine (FLEXERIL) 10 MG tablet Take 1 tablet (10 mg total) by mouth every 8 (eight) hours as needed for muscle spasms. 07/10/18   Will Bonnet, MD  drospirenone-ethinyl estradiol (YAZ) 3-0.02 MG tablet Take 1 tablet by mouth daily. 05/04/19   Ronnell Freshwater, NP  HYDROcodone-acetaminophen (NORCO/VICODIN) 5-325 MG tablet 1-2 tabs po qd prn 06/09/19   Norval Gable, MD  metaxalone (SKELAXIN) 800 MG tablet Take 1 tablet (800 mg total) by mouth 3 (three) times daily as needed for muscle spasms. 08/14/18   Will Bonnet, MD  Prenatal Vit-Fe Fumarate-FA (PRENATAL MULTIVITAMIN) TABS tablet Take 1 tablet by mouth daily at 12 noon. 12/04/15   Juliene Pina, CNM    Family History Family History  Problem Relation Age of Onset  .  Hypothyroidism Mother   . COPD Maternal Grandmother        reactive airway disease    Social History Social History   Tobacco Use  . Smoking status: Current Some Day Smoker    Types: Cigarettes  . Smokeless tobacco: Never Used  Substance Use Topics  . Alcohol use: Yes    Alcohol/week: 0.0 standard drinks    Comment: Occasional  . Drug use: No     Allergies   Patient has no known allergies.   Review of Systems Review of Systems   Physical Exam Triage Vital Signs ED Triage Vitals  Enc Vitals Group     BP 06/09/19 0827 131/78     Pulse Rate 06/09/19 0825 88     Resp 06/09/19 0825 16     Temp 06/09/19 0825 98 F (36.7 C)     Temp src --      SpO2 06/09/19 0825 100 %     Weight --       Height --      Head Circumference --      Peak Flow --      Pain Score 06/09/19 0825 1     Pain Loc --      Pain Edu? --      Excl. in [place]? --    No data found.  Updated Vital Signs BP 131/78   Pulse 88   Temp 98 F (36.7 C)   Resp 16   LMP 05/09/2019   SpO2 100%   Visual Acuity Right Eye Distance:   Left Eye Distance:   Bilateral Distance:    Right Eye Near:   Left Eye Near:    Bilateral Near:     Physical Exam Vitals and nursing note reviewed.  Constitutional:      General: She is not in acute distress.    Appearance: She is not toxic-appearing or diaphoretic.  Cardiovascular:     Pulses: Normal pulses.  Musculoskeletal:     Comments: Right leg with mild edema noted along the calf area and behind the knee; tenderness to palpation of the calf and right lower inner thigh area; no erythema, skin discoloration or skin lesions; normal pulses; extremity neurovascularly intact  Neurological:     Mental Status: She is alert.      UC Treatments / Results  Labs (all labs ordered are listed, but only abnormal results are displayed) Labs Reviewed - No data to display  EKG   Radiology US Venous Img Lower Unilateral Right (DVT)  Result Date: 06/09/2019 CLINICAL DATA:  Lower extremity pain and edema EXAM: RIGHT LOWER EXTREMITY VENOUS DUPLEX ULTRASOUND TECHNIQUE: Gray-scale sonography with graded compression, as well as color Doppler and duplex ultrasound were performed to evaluate the right lower extremity deep venous system from the level of the common femoral vein and including the common femoral, femoral, profunda femoral, popliteal and calf veins including the posterior tibial, peroneal and gastrocnemius veins when visible. The superficial great saphenous vein was also interrogated. Spectral Doppler was utilized to evaluate flow at rest and with distal augmentation maneuvers in the common femoral, femoral and popliteal veins. COMPARISON:  None. FINDINGS: Contralateral  Common Femoral Vein: Respiratory phasicity is normal and symmetric with the symptomatic side. No evidence of thrombus. Normal compressibility. Common Femoral Vein: No evidence of thrombus. Normal compressibility, respiratory phasicity and response to augmentation. Saphenofemoral Junction: No evidence of thrombus. Normal compressibility and flow on color Doppler imaging. Profunda Femoral Vein: No evidence of thrombus. Normal compressibility  and flow on color Doppler imaging. Femoral Vein: No evidence of thrombus. Normal compressibility, respiratory phasicity and response to augmentation. Popliteal Vein: No evidence of thrombus. Normal compressibility, respiratory phasicity and response to augmentation. Calf Veins: No evidence of thrombus. Normal compressibility and flow on color Doppler imaging. Superficial Great Saphenous Vein: No evidence of thrombus. Normal compressibility. Venous Reflux:  None. Other Findings:  None. IMPRESSION: No evidence of deep venous thrombosis in the right lower extremity. Left common femoral vein also patent. Electronically Signed   By: Lowella Grip III M.D.   On: 06/09/2019 09:37    Procedures Procedures (including critical care time)  Medications Ordered in UC Medications - No data to display  Initial Impression / Assessment and Plan / UC Course  I have reviewed the triage vital signs and the nursing notes.  Pertinent labs & imaging results that were available during my care of the patient were reviewed by me and considered in my medical decision making (see chart for details).      Final Clinical Impressions(s) / UC Diagnoses   Final diagnoses:  Pain and swelling of right lower leg     Discharge Instructions     Ultrasound at [facility]    ED Prescriptions    Medication Sig Dispense Auth. Provider   HYDROcodone-acetaminophen (NORCO/VICODIN) 5-325 MG tablet 1-2 tabs po qd prn 6 tablet Norval Gable, MD     1. Diagnosis and possible etiologies reviewed  with patient 2. Recommend lower extremity ultrasound today; will be done at [facility] outpatient imaging; further management pending [place] result 3. [place] result negative for DVT; result discussed with patient 4. rx as per orders above; reviewed possible side effects, interactions, risks and benefits  5. Follow up with PCP for further evaluation and/or referral 6. Follow-up prn   I have reviewed the PDMP during this encounter.   Norval Gable, MD 06/09/19 1005

## 2019-06-10 ENCOUNTER — Encounter: Payer: Self-pay | Admitting: Nurse Practitioner

## 2019-10-29 ENCOUNTER — Ambulatory Visit: Payer: BC Managed Care – PPO | Admitting: Hospice and Palliative Medicine

## 2019-11-10 ENCOUNTER — Other Ambulatory Visit: Payer: Self-pay

## 2019-11-10 ENCOUNTER — Encounter: Payer: Self-pay | Admitting: Obstetrics and Gynecology

## 2019-11-10 ENCOUNTER — Ambulatory Visit (INDEPENDENT_AMBULATORY_CARE_PROVIDER_SITE_OTHER): Payer: BC Managed Care – PPO | Admitting: Obstetrics and Gynecology

## 2019-11-10 VITALS — BP 140/82 | Ht 66.0 in | Wt 167.0 lb

## 2019-11-10 DIAGNOSIS — Z1331 Encounter for screening for depression: Secondary | ICD-10-CM

## 2019-11-10 DIAGNOSIS — R5383 Other fatigue: Secondary | ICD-10-CM

## 2019-11-10 DIAGNOSIS — Z1339 Encounter for screening examination for other mental health and behavioral disorders: Secondary | ICD-10-CM | POA: Diagnosis not present

## 2019-11-10 DIAGNOSIS — Z01419 Encounter for gynecological examination (general) (routine) without abnormal findings: Secondary | ICD-10-CM | POA: Diagnosis not present

## 2019-11-10 DIAGNOSIS — F3281 Premenstrual dysphoric disorder: Secondary | ICD-10-CM

## 2019-11-10 NOTE — Progress Notes (Signed)
Gynecology Annual Exam   PCP: Ronnell Freshwater, NP  Chief Complaint  Patient presents with  . Annual Exam   History of Present Illness:  Ms. Greidys Deland is a 34 y.o. G2P2002 who LMP was Patient's last menstrual period was 10/30/2019 (exact date)., presents today for her annual examination.  Her menses are regular every 28-30 days, lasting 5 day(s).  Dysmenorrhea mild, occurring first 1-2 days of flow. She notes occasional bleeding around the time of ovulation ("a spot here or there.")   She is sexually active. No pain with intercourse. .  Last Pap: 11/20/2017  Results were: no abnormalities /neg HPV DNA negative Hx of STDs: none  There is no FH of breast cancer. There is no FH of ovarian cancer. The patient sometimes does do self-breast exams.  Tobacco use: rare Alcohol use: social drinker Exercise: "just chasing the kids"  The patient wears seatbelts: yes.   The patient reports that domestic violence in her life is absent.   She has felt very fatigued, monthly, around the time of ovulation.  She was started on Yaz and she thinks the fatigue is getting better, but the PMS is getting worse.    She started taking Vyvanse recently. She started leaking milk from her nipples. She had this one time during intercourse.   She rates high on a depression score.  She has been offered medication from her PCP.  She doesn't want to have to take pills, if there is something else that explains the issues.  She has had a significant workup back in April with normal findings.  No explanation to her fatigue.  She is taking an ovulation-suppressing medication, and this medication is monophasic. So, she should not be ovulating.   Past Medical History:  Diagnosis Date  . ADD (attention deficit disorder)   . Cholestasis during pregnancy   . Depression   . Vaginal Pap smear, abnormal     Past Surgical History:  Procedure Laterality Date  . WISDOM TOOTH EXTRACTION      Prior to Admission  medications   Medication Sig Start Date End Date Taking? Authorizing Provider  acetaminophen (TYLENOL) 500 MG tablet Take 500 mg by mouth every 6 (six) hours as needed for mild pain or headache.   Yes [provider]  cetirizine (ZYRTEC) 10 MG tablet Take 10 mg by mouth daily.   Yes [provider]  drospirenone-ethinyl estradiol (YAZ) 3-0.02 MG tablet Take 1 tablet by mouth daily. 05/04/19  Yes Boscia, Greer Ee, NP  metaxalone (SKELAXIN) 800 MG tablet Take 1 tablet (800 mg total) by mouth 3 (three) times daily as needed for muscle spasms. 08/14/18  Yes Will Bonnet, MD   Allergies: No Known Allergies  Obstetric History: [id number]  Social History   Socioeconomic History  . Marital status: Married    Spouse name: Not on file  . Number of children: Not on file  . Years of education: Not on file  . Highest education level: Not on file  Occupational History  . Not on file  Tobacco Use  . Smoking status: Current Some Day Smoker    Types: Cigarettes  . Smokeless tobacco: Never Used  Substance and Sexual Activity  . Alcohol use: Yes    Alcohol/week: 0.0 standard drinks    Comment: Occasional  . Drug use: No  . Sexual activity: Yes    Birth control/protection: None  Other Topics Concern  . Not on file  Social History Narrative  .  Not on file   Social Determinants of Health   Financial Resource Strain:   . Difficulty of Paying Living Expenses: Not on file  Food Insecurity:   . Worried About Charity fundraiser in the Last Year: Not on file  . Ran Out of Food in the Last Year: Not on file  Transportation Needs:   . Lack of Transportation (Medical): Not on file  . Lack of Transportation (Non-Medical): Not on file  Physical Activity:   . Days of Exercise per Week: Not on file  . Minutes of Exercise per Session: Not on file  Stress:   . Feeling of Stress : Not on file  Social Connections:   . Frequency of Communication with Friends and Family: Not on file   . Frequency of Social Gatherings with Friends and Family: Not on file  . Attends Religious Services: Not on file  . Active Member of Clubs or Organizations: Not on file  . Attends Archivist Meetings: Not on file  . Marital Status: Not on file  Intimate Partner Violence:   . Fear of Current or Ex-Partner: Not on file  . Emotionally Abused: Not on file  . Physically Abused: Not on file  . Sexually Abused: Not on file    Family History  Problem Relation Age of Onset  . Hypothyroidism Mother   . COPD Maternal Grandmother        reactive airway disease    Review of Systems  Constitutional: Positive for malaise/fatigue. Negative for chills, diaphoresis, fever and weight loss.  HENT: Negative.   Eyes: Positive for redness. Negative for blurred vision, double vision, photophobia, pain and discharge.  Respiratory: Negative.   Cardiovascular: Negative.   Gastrointestinal: Negative.   Genitourinary: Negative.   Musculoskeletal: Positive for joint pain. Negative for back pain, falls, myalgias and neck pain.  Skin: Negative.   Neurological: Negative.   Endo/Heme/Allergies: Positive for environmental allergies. Negative for polydipsia. Does not bruise/bleed easily.  Psychiatric/Behavioral: Positive for depression. Negative for hallucinations, memory loss, substance abuse and suicidal ideas. The patient is nervous/anxious. The patient does not have insomnia.      Physical Exam BP 140/82   Ht 5\' 6"  (1.676 m)   Wt 167 lb (75.8 kg)   LMP 10/30/2019 (Exact Date)   BMI 26.95 kg/m    Physical Exam Constitutional:      General: She is not in acute distress.    Appearance: Normal appearance. She is well-developed.  Genitourinary:     Pelvic exam was performed with patient in the lithotomy position.     Vulva, urethra, bladder and uterus normal.     No inguinal adenopathy present in the right or left side.    No signs of injury in the vagina.     No vaginal discharge,  erythema, tenderness or bleeding.     No cervical motion tenderness, discharge, lesion or polyp.     Uterus is mobile.     Uterus is not enlarged or tender.     No uterine mass detected.    Uterus is anteverted.     No right or left adnexal mass present.     Right adnexa not tender or full.     Left adnexa not tender or full.  HENT:     Head: Normocephalic and atraumatic.  Eyes:     General: No scleral icterus.    Conjunctiva/sclera: Conjunctivae normal.  Neck:     Thyroid: No thyromegaly.  Cardiovascular:  Rate and Rhythm: Normal rate and regular rhythm.     Heart sounds: No murmur heard.  No friction rub. No gallop.   Pulmonary:     Effort: Pulmonary effort is normal. No respiratory distress.     Breath sounds: Normal breath sounds. No wheezing or rales.  Chest:     Breasts:        Right: No inverted nipple, mass, nipple discharge, skin change or tenderness.        Left: No inverted nipple, mass, nipple discharge, skin change or tenderness.  Abdominal:     General: Bowel sounds are normal. There is no distension.     Palpations: Abdomen is soft. There is no mass.     Tenderness: There is no abdominal tenderness. There is no guarding or rebound.  Musculoskeletal:        General: No swelling or tenderness. Normal range of motion.     Cervical back: Normal range of motion and neck supple.  Lymphadenopathy:     Cervical: No cervical adenopathy.     Lower Body: No right inguinal adenopathy. No left inguinal adenopathy.  Neurological:     General: No focal deficit present.     Mental Status: She is alert and oriented to person, place, and time.     Cranial Nerves: No cranial nerve deficit.  Skin:    General: Skin is warm and dry.     Findings: No erythema or rash.  Psychiatric:        Mood and Affect: Mood normal.        Behavior: Behavior normal.        Judgment: Judgment normal.     Female chaperone present for pelvic and breast  portions of the physical  exam  Results: AUDIT Questionnaire (screen for alcoholism): 5 PHQ-9: 16   Assessment: 34 y.o. G110P2002 female here for routine annual gynecologic examination  Plan: Problem List Items Addressed This Visit      Other   Other fatigue   Premenstrual dysphoric disorder    Other Visit Diagnoses    Women's annual routine gynecological examination    -  Primary   Screening for depression       Screening for alcoholism          Screening: -- Blood pressure screen elevated: continued to monitor. -- Weight screening: normal -- Depression screening negative (PHQ-9) -- Nutrition: normal -- cholesterol screening: not due for screening -- osteoporosis screening: not due -- tobacco screening: not using -- alcohol screening: AUDIT questionnaire indicates low-risk usage. -- family history of breast cancer screening: done. not at high risk. -- no evidence of domestic violence or intimate partner violence. -- STD screening: gonorrhea/chlamydia NAAT not collected per patient request. -- pap smear not collected per ASCCP guidelines -- flu vaccine will receive through work  Fatigue and PMDD: Discussed that many of her symptoms could be explained by depression. She has had a significant workup from a physical standpoint to assess her fatigue and this has returned as normal.  She states that her psychiatrist has recommended that she be on medication for depression (she scores high on the PHQ9 today).  I agree with this recommendation and asked her to contact her psychiatrist.  I recommend changing only one medication at a time (I think it's OK if she stop Vyvanse and get back on Adderall).  However, there is the option to put her on extended-cycle birth control pills.  We may do that after she has had a  significant trial of anti-depressant medication, many of which work well with PMDD (SSRI class).  She may contact me at any point in the future to discuss further.   Prentice Docker, MD 11/10/2019 2:58  PM

## 2019-11-27 ENCOUNTER — Ambulatory Visit: Payer: BC Managed Care – PPO | Admitting: Nurse Practitioner

## 2020-03-09 ENCOUNTER — Other Ambulatory Visit: Payer: Self-pay | Admitting: Nurse Practitioner

## 2020-03-09 DIAGNOSIS — Z30011 Encounter for initial prescription of contraceptive pills: Secondary | ICD-10-CM

## 2020-03-16 ENCOUNTER — Other Ambulatory Visit: Payer: Self-pay | Admitting: Obstetrics and Gynecology

## 2020-03-16 DIAGNOSIS — R102 Pelvic and perineal pain: Secondary | ICD-10-CM

## 2020-03-21 ENCOUNTER — Other Ambulatory Visit: Payer: Self-pay | Admitting: Nurse Practitioner

## 2020-03-21 DIAGNOSIS — Z30011 Encounter for initial prescription of contraceptive pills: Secondary | ICD-10-CM

## 2020-03-25 ENCOUNTER — Other Ambulatory Visit: Payer: Self-pay | Admitting: Nurse Practitioner

## 2020-03-25 DIAGNOSIS — Z30011 Encounter for initial prescription of contraceptive pills: Secondary | ICD-10-CM

## 2020-03-30 ENCOUNTER — Other Ambulatory Visit: Payer: Self-pay

## 2020-03-30 DIAGNOSIS — Z30011 Encounter for initial prescription of contraceptive pills: Secondary | ICD-10-CM

## 2020-03-30 MED ORDER — DROSPIRENONE-ETHINYL ESTRADIOL 3-0.02 MG PO TABS: 1.0000 | ORAL_TABLET | Freq: Every day | ORAL | 4 refills | Status: AC

## 2020-04-09 ENCOUNTER — Telehealth: Payer: Self-pay | Admitting: Obstetrics and Gynecology

## 2020-04-09 NOTE — Telephone Encounter (Signed)
Left generic VM 

## 2020-05-11 NOTE — Telephone Encounter (Signed)
Pt calling; is having the same issue this month as last month at this time c bc; has slept for three straight days; doesn't know what to do; needs something to change.  [phone]

## 2020-05-12 ENCOUNTER — Ambulatory Visit: Payer: BC Managed Care – PPO | Admitting: Hospice and Palliative Medicine

## 2020-05-12 ENCOUNTER — Other Ambulatory Visit: Payer: Self-pay

## 2020-05-12 ENCOUNTER — Encounter: Payer: Self-pay | Admitting: Hospice and Palliative Medicine

## 2020-05-12 VITALS — BP 126/88 | HR 70 | Temp 97.7°F | Resp 16 | Ht 66.0 in | Wt 175.4 lb

## 2020-05-12 DIAGNOSIS — G4719 Other hypersomnia: Secondary | ICD-10-CM

## 2020-05-12 DIAGNOSIS — G4710 Hypersomnia, unspecified: Secondary | ICD-10-CM

## 2020-05-12 DIAGNOSIS — F3281 Premenstrual dysphoric disorder: Secondary | ICD-10-CM

## 2020-05-12 DIAGNOSIS — R5383 Other fatigue: Secondary | ICD-10-CM

## 2020-05-12 NOTE — Telephone Encounter (Signed)
Patient is scheduled for Phone visit 05/13/20 with SDJ

## 2020-05-12 NOTE — Progress Notes (Signed)
[facility] [place], [place] [address]  Internal MEDICINE  Office Visit Note  Patient Name: Annette Lawson  [id number]  [id number]  Date of Service: 05/13/2020  Chief Complaint  Patient presents with  . Acute Visit    slept for 4 days Saturday night to Tuesday morning, without waking up to eat, drink, go to the bathroom, this is the 2nd time it has happened, the first time was last month right before period, pt slept for 2 days, pt has been trying to rehydrate yesterday, pt is still dizzy, fatigue gets to be too much, cant keep eyes open, fingers were swollen, legs felt tight, pt is nervous this will keep happening every month     HPI Pt is here for a sick visit. Complaining of episodes of excessive fatigue and sleepiness, staying in bed for several days at a time Episodes happening right before her period is scheduled Last month only slept for 2 straight days Followed by psychiatry--currently taking Adderall and Cymbalta During the days that she was sleeping she did not take Adderall-- did continue to take her Cymbalta as she feared she would go through withdrawals  No recent changes in her menstrual cycles--last month after first episode her menstrual cycle was normal  Drinks alcohol socially, smokes about 3-4 cigarettes per day  Current Medication:  Outpatient Encounter Medications as of 05/12/2020  Medication Sig Note  . acetaminophen (TYLENOL) 500 MG tablet Take 500 mg by mouth every 6 (six) hours as needed for mild pain or headache.   . amphetamine-dextroamphetamine (ADDERALL) 15 MG tablet Take 15 mg by mouth in the morning, at noon, and at bedtime.   . cetirizine (ZYRTEC) 10 MG tablet Take 10 mg by mouth daily.   . drospirenone-ethinyl estradiol (YAZ) 3-0.02 MG tablet Take 1 tablet by mouth daily.   . DULoxetine (CYMBALTA) 60 MG capsule Take 60 mg by mouth daily.   . metaxalone (SKELAXIN) 800 MG tablet TAKE 1 TABLET(800 MG) BY MOUTH THREE TIMES  DAILY AS NEEDED FOR MUSCLE SPASMS   . [DISCONTINUED] cyclobenzaprine (FLEXERIL) 10 MG tablet Take 1 tablet (10 mg total) by mouth every 8 (eight) hours as needed for muscle spasms. (Patient not taking: Reported on 11/10/2019) 08/14/2018: As needed   . [DISCONTINUED] HYDROcodone-acetaminophen (NORCO/VICODIN) 5-325 MG tablet 1-2 tabs po qd prn (Patient not taking: Reported on 11/10/2019)   . [DISCONTINUED] Prenatal Vit-Fe Fumarate-FA (PRENATAL MULTIVITAMIN) TABS tablet Take 1 tablet by mouth daily at 12 noon. (Patient not taking: Reported on 11/10/2019)    No facility-administered encounter medications on file as of 05/12/2020.      Medical History: Past Medical History:  Diagnosis Date  . ADD (attention deficit disorder)   . Cholestasis during pregnancy   . Depression   . Vaginal Pap smear, abnormal      Vital Signs: BP 126/88   Pulse 70   Temp 97.7 F (36.5 C)   Resp 16   Ht 5\' 6"  (1.676 m)   Wt 175 lb 6.4 oz (79.6 kg)   LMP 04/13/2020   SpO2 98%   BMI 28.31 kg/m    Review of Systems  Constitutional: Positive for fatigue. Negative for chills and diaphoresis.  HENT: Negative for ear pain, postnasal drip and sinus pressure.   Eyes: Negative for photophobia, discharge, redness, itching and visual disturbance.  Respiratory: Negative for cough, shortness of breath and wheezing.   Cardiovascular: Negative for chest pain, palpitations and leg swelling.  Gastrointestinal: Negative for abdominal pain, constipation,  diarrhea, nausea and vomiting.  Genitourinary: Negative for dysuria and flank pain.  Musculoskeletal: Negative for arthralgias, back pain, gait problem and neck pain.  Skin: Negative for color change.  Allergic/Immunologic: Negative for environmental allergies and food allergies.  Neurological: Positive for weakness. Negative for dizziness and headaches.  Hematological: Does not bruise/bleed easily.  Psychiatric/Behavioral: Negative for agitation, behavioral problems  (depression) and hallucinations.    Physical Exam Vitals reviewed.  Constitutional:      Appearance: Normal appearance. She is normal weight.  Cardiovascular:     Rate and Rhythm: Normal rate and regular rhythm.     Pulses: Normal pulses.     Heart sounds: Normal heart sounds.  Pulmonary:     Effort: Pulmonary effort is normal.     Breath sounds: Normal breath sounds.  Musculoskeletal:        General: Normal range of motion.  Skin:    General: Skin is warm.  Neurological:     General: No focal deficit present.     Mental Status: She is alert and oriented to person, place, and time. Mental status is at baseline.  Psychiatric:        Mood and Affect: Mood normal.        Behavior: Behavior normal.        Thought Content: Thought content normal.        Judgment: Judgment normal.     Assessment/Plan: 1. Excessive sleepiness Extensive review of labs for possible underlying cause contributing to symptoms Consider head CT as indicated - CBC w/Diff/Platelet - Comprehensive Metabolic Panel (CMET) - Lipid Panel With LDL/HDL Ratio - TSH + free T4 - Prolactin - Vitamin B12 - Folate - Iron and TIBC - Ferritin - Vitamin D (25 hydroxy)  2. Premenstrual dysphoric disorder Followed by psychiatry as well as GYN--started on Cymbalta  General Counseling: Taylyn verbalizes understanding of the findings of todays visit and agrees with plan of treatment. I have discussed any further diagnostic evaluation that may be needed or ordered today. We also reviewed her medications today. she has been encouraged to call the office with any questions or concerns that should arise related to todays visit.   Orders Placed This Encounter  Procedures  . CBC w/Diff/Platelet  . Comprehensive Metabolic Panel (CMET)  . Lipid Panel With LDL/HDL Ratio  . TSH + free T4  . Prolactin  . Vitamin B12  . Folate  . Iron and TIBC  . Ferritin  . Vitamin D (25 hydroxy)    Time spent: 25 Minutes Time  spent includes review of chart, medications, test results and follow-up plan with the patient.  This patient was seen by Theodoro Grist AGNP-C in Collaboration with Dr Lavera Guise as a part of collaborative care agreement.  Tanna Furry [facility] Internal Medicine

## 2020-05-13 ENCOUNTER — Encounter: Payer: Self-pay | Admitting: Hospice and Palliative Medicine

## 2020-05-13 ENCOUNTER — Encounter: Payer: Self-pay | Admitting: Obstetrics and Gynecology

## 2020-05-13 ENCOUNTER — Ambulatory Visit (INDEPENDENT_AMBULATORY_CARE_PROVIDER_SITE_OTHER): Payer: BC Managed Care – PPO | Admitting: Obstetrics and Gynecology

## 2020-05-13 VITALS — BP 111/76 | HR 85 | Resp 18 | Ht 66.0 in | Wt 175.0 lb

## 2020-05-13 DIAGNOSIS — R5383 Other fatigue: Secondary | ICD-10-CM | POA: Diagnosis not present

## 2020-05-13 NOTE — Progress Notes (Signed)
Obstetrics & Gynecology Office Visit   Chief Complaint  Patient presents with  . Gynecologic Exam  . Follow-up    History of Present Illness: 35 y.o. G39P2002 female who presents for issues with her menses.  She has a history of extreme sleepiness with her menses. This occurred  Her menses prior to her most recent. This past menses she went to sleep Saturday night, got up for a short while Sunday morning, then slept again until Tuesday. She states that she didn't even use the bathroom in this time.  She got up Tuesday and had to hydrate prior to voiding.  She states that she is voiding more and is taking in more liquid for hydration. She doesn't remember what it is like not to take birth control.  She felt very bad Tuesday when she woke up.  She believes the Cymbalta is helping with her mood symptoms. Her muscle pain is better with Cymablta, too.   She had a visit with her PCP yesterday and labs were ordered (CBC, CMP, Lipid panel, TSH/FT4, prolactin, Vit B12, Folate, Iron studies, Vitamind D). These labs were entered today. She also had a virtual visit through [facility] and she states she was given an NSAID, which she has not started taking.  She started the white pills yesterday. So, this occurred during the active pill pack dosing.     Past Medical History:  Diagnosis Date  . ADD (attention deficit disorder)   . Cholestasis during pregnancy   . Depression   . Vaginal Pap smear, abnormal     Past Surgical History:  Procedure Laterality Date  . WISDOM TOOTH EXTRACTION      Gynecologic History: Patient's last menstrual period was 04/13/2020.  Obstetric History: [id number]  Family History  Problem Relation Age of Onset  . Hypothyroidism Mother   . COPD Maternal Grandmother        reactive airway disease    Social History   Socioeconomic History  . Marital status: Married    Spouse name: Not on file  . Number of children: Not on file  . Years of education: Not on file  .  Highest education level: Not on file  Occupational History  . Not on file  Tobacco Use  . Smoking status: Current Every Day Smoker    Types: Cigarettes  . Smokeless tobacco: Never Used  Substance and Sexual Activity  . Alcohol use: Yes    Alcohol/week: 0.0 standard drinks    Comment: Occasional  . Drug use: No  . Sexual activity: Yes    Birth control/protection: None  Other Topics Concern  . Not on file  Social History Narrative  . Not on file   Social Determinants of Health   Financial Resource Strain: Not on file  Food Insecurity: Not on file  Transportation Needs: Not on file  Physical Activity: Not on file  Stress: Not on file  Social Connections: Not on file  Intimate Partner Violence: Not on file    No Known Allergies  Prior to Admission medications   Medication Sig Start Date End Date Taking? Authorizing Provider  acetaminophen (TYLENOL) 500 MG tablet Take 500 mg by mouth every 6 (six) hours as needed for mild pain or headache.   Yes [provider]  amphetamine-dextroamphetamine (ADDERALL) 15 MG tablet Take 15 mg by mouth in the morning, at noon, and at bedtime.   Yes [provider]  cetirizine (ZYRTEC) 10 MG tablet Take 10 mg by mouth daily.   Yes  [provider]  drospirenone-ethinyl estradiol (YAZ) 3-0.02 MG tablet Take 1 tablet by mouth daily. 03/30/20  Yes Will Bonnet, MD  DULoxetine (CYMBALTA) 60 MG capsule Take 60 mg by mouth daily.   Yes [provider]  metaxalone (SKELAXIN) 800 MG tablet TAKE 1 TABLET(800 MG) BY MOUTH THREE TIMES DAILY AS NEEDED FOR MUSCLE SPASMS 03/17/20  Yes Will Bonnet, MD    Review of Systems  Constitutional: Negative.   HENT: Negative.   Eyes: Negative.   Respiratory: Negative.   Cardiovascular: Negative.   Gastrointestinal: Negative.   Genitourinary: Negative.   Musculoskeletal: Negative.   Skin: Negative.   Neurological: Negative.   Psychiatric/Behavioral: Negative.       Physical Exam BP 111/76   Pulse 85   Resp 18   Ht 5\' 6"  (1.676 m)   Wt 175 lb (79.4 kg)   LMP 04/13/2020   SpO2 98%   BMI 28.25 kg/m  Patient's last menstrual period was 04/13/2020. Physical Exam Constitutional:      General: She is not in acute distress.    Appearance: Normal appearance.  HENT:     Head: Normocephalic and atraumatic.  Eyes:     General: No scleral icterus.    Conjunctiva/sclera: Conjunctivae normal.  Neurological:     General: No focal deficit present.     Mental Status: She is alert and oriented to person, place, and time.     Cranial Nerves: No cranial nerve deficit.  Psychiatric:        Mood and Affect: Mood normal.        Behavior: Behavior normal.        Judgment: Judgment normal.     Female chaperone present for pelvic and breast  portions of the physical exam  Assessment: 35 y.o. G81P2002 female here for  1. Other fatigue      Plan: Problem List Items Addressed This Visit      Other   Other fatigue - Primary     Based on her reported symptoms and lack of voiding for nearly 3 days, I am more concerned about an etiology which causes her to be both very fatigued and her kidneys do not seem to be functioning well in this time.  I am extremely concerned about her kidney function and what might be causing any renal dysfunction.  At this point, I will await the work-up from her primary care provider and continue to follow along.  She had a battery of labs drawn today, which should result tomorrow.  I advised her not to change her birth control medication at this time until we have further information based on her lab results.  The patient indicates that her PCP may want to order a head CT, if her labs return is normal.  I discussed how long she slept and her lack of voiding with her.  I emphasized my high level of concern that something besides her periods is causing this.  It may be related to her menstrual cycle.  However, to this date I have not  seen a syndrome related to menses that causes this level of fatigue and lack of kidney function.  I strongly advised her that if she has this type of fatigue again he goes to sleep that someone should be responsible for waking her every 12 hours to hydrate her and feed her and not allow her to return to sleep until she has voided at least once.  I advised her that if she  is unarousable, she should be immediately taken to the emergency room.  If she goes 2 days without voiding, she should also consider going to the emergency room as she is likely to have kidney damage of unknown etiology and will need treatment quickly.  Her sleep that she describes sounds nearly comatose in nature and concerns me greatly.  Again, I will await the work-up of her primary care provider and not stepdown at this time apart from to expressed my deep concern about the situation.  A total of 29 minutes were spent face-to-face with the patient as well as preparation, review, communication, and documentation during this encounter.    Prentice Docker, MD 05/13/2020 12:37 PM

## 2020-05-14 LAB — CBC WITH DIFFERENTIAL/PLATELET
Basophils Absolute: 0 10*3/uL (ref 0.0–0.2)
Basos: 1 %
EOS (ABSOLUTE): 0 10*3/uL (ref 0.0–0.4)
Eos: 1 %
Hematocrit: 40.7 % (ref 34.0–46.6)
Hemoglobin: 13.7 g/dL (ref 11.1–15.9)
Immature Grans (Abs): 0 10*3/uL (ref 0.0–0.1)
Immature Granulocytes: 0 %
Lymphocytes Absolute: 1.6 10*3/uL (ref 0.7–3.1)
Lymphs: 28 %
MCH: 30.8 pg (ref 26.6–33.0)
MCHC: 33.7 g/dL (ref 31.5–35.7)
MCV: 92 fL (ref 79–97)
Monocytes Absolute: 0.4 10*3/uL (ref 0.1–0.9)
Monocytes: 8 %
Neutrophils Absolute: 3.6 10*3/uL (ref 1.4–7.0)
Neutrophils: 62 %
Platelets: 307 10*3/uL (ref 150–450)
RBC: 4.45 x10E6/uL (ref 3.77–5.28)
RDW: 11.7 % (ref 11.7–15.4)
WBC: 5.7 10*3/uL (ref 3.4–10.8)

## 2020-05-14 LAB — COMPREHENSIVE METABOLIC PANEL
ALT: 26 IU/L (ref 0–32)
AST: 25 IU/L (ref 0–40)
Albumin/Globulin Ratio: 1.8 (ref 1.2–2.2)
Albumin: 4.5 g/dL (ref 3.8–4.8)
Alkaline Phosphatase: 68 IU/L (ref 44–121)
BUN/Creatinine Ratio: 14 (ref 9–23)
BUN: 10 mg/dL (ref 6–20)
Bilirubin Total: 0.3 mg/dL (ref 0.0–1.2)
CO2: 19 mmol/L — ABNORMAL LOW (ref 20–29)
Calcium: 9.4 mg/dL (ref 8.7–10.2)
Chloride: 103 mmol/L (ref 96–106)
Creatinine, Ser: 0.69 mg/dL (ref 0.57–1.00)
Globulin, Total: 2.5 g/dL (ref 1.5–4.5)
Glucose: 98 mg/dL (ref 65–99)
Potassium: 4.7 mmol/L (ref 3.5–5.2)
Sodium: 140 mmol/L (ref 134–144)
Total Protein: 7 g/dL (ref 6.0–8.5)
eGFR: 117 mL/min/{1.73_m2} (ref >59)

## 2020-05-14 LAB — FERRITIN: Ferritin: 50 ng/mL (ref 15–150)

## 2020-05-14 LAB — IRON AND TIBC
Iron Saturation: 25 % (ref 15–55)
Iron: 99 ug/dL (ref 27–159)
Total Iron Binding Capacity: 401 ug/dL (ref 250–450)
UIBC: 302 ug/dL (ref 131–425)

## 2020-05-14 LAB — LIPID PANEL WITH LDL/HDL RATIO
Cholesterol, Total: 155 mg/dL (ref 100–199)
HDL: 66 mg/dL (ref >39)
LDL Chol Calc (NIH): 67 mg/dL (ref 0–99)
LDL/HDL Ratio: 1 ratio (ref 0.0–3.2)
Triglycerides: 130 mg/dL (ref 0–149)
VLDL Cholesterol Cal: 22 mg/dL (ref 5–40)

## 2020-05-14 LAB — FOLATE: Folate: 18.7 ng/mL (ref >3.0)

## 2020-05-14 LAB — PROLACTIN: Prolactin: 5.6 ng/mL (ref 4.8–23.3)

## 2020-05-14 LAB — TSH+FREE T4
Free T4: 1.27 ng/dL (ref 0.82–1.77)
TSH: 0.484 u[IU]/mL (ref 0.450–4.500)

## 2020-05-14 LAB — VITAMIN D 25 HYDROXY (VIT D DEFICIENCY, FRACTURES): Vit D, 25-Hydroxy: 31.6 ng/mL (ref 30.0–100.0)

## 2020-05-14 LAB — VITAMIN B12: Vitamin B-12: 669 pg/mL (ref 232–1245)

## 2020-05-18 ENCOUNTER — Ambulatory Visit: Payer: BC Managed Care – PPO | Admitting: Obstetrics and Gynecology

## 2020-05-25 ENCOUNTER — Ambulatory Visit (INDEPENDENT_AMBULATORY_CARE_PROVIDER_SITE_OTHER): Payer: BC Managed Care – PPO | Admitting: Nurse Practitioner

## 2020-05-25 ENCOUNTER — Encounter: Payer: Self-pay | Admitting: Nurse Practitioner

## 2020-05-25 ENCOUNTER — Other Ambulatory Visit: Payer: Self-pay

## 2020-05-25 VITALS — BP 120/90 | HR 91 | Temp 97.4°F | Resp 16 | Ht 66.0 in | Wt 174.6 lb

## 2020-05-25 DIAGNOSIS — G4710 Hypersomnia, unspecified: Secondary | ICD-10-CM

## 2020-05-25 DIAGNOSIS — F3281 Premenstrual dysphoric disorder: Secondary | ICD-10-CM

## 2020-05-25 DIAGNOSIS — E042 Nontoxic multinodular goiter: Secondary | ICD-10-CM | POA: Diagnosis not present

## 2020-05-25 NOTE — Progress Notes (Signed)
[facility] [place], [place] [address]  Internal MEDICINE  Office Visit Note  Patient Name: Annette Lawson  [id number]  [id number]  Date of Service: 05/30/2020  Chief Complaint  Patient presents with  . Follow-up    [place] allergic reaction nasal spay, throat burning, red, lost voice, sinus pressure sever, lips numb, drooling, pt took benadryl and Tylenol which helped, pt went to play ground with fam, swing set made pt dizzy, felt like brain was sliding around, dull pain around kidney areas and lower back, has had sinus pressure and sinusitis, silence is loud, like a ring hiss noise, night sweats but gets chills at night, no sex drive, still has random production of breast milk, has not breast fed in 1.5 yrs  . Depression  . ADD    Med loses effectiveness, only helps pt stay awake before cycle starts     HPI Annette Lawson presents for a 2 week follow up visit regarding ADHD, depression and episodes of hypersomnia -The hypersomnia started on 4/30. She has had 2 episodes of sleeping for more than 24 hours without waking to perform any bodily functions including eating, drinking or elimination. The first episode lasted for 4 days and the second episode lasted for 2 days. She has a history of PMDD, anxiety and depression. She reports that her medications do not seem to work around the time of her cycle as early as 2 weeks before her period starts.  Recent labs were wnl except for CO2 of 19 on her metabolic panel, CBC was negative for anemia.  Her thyroid has been swollen in he past and has nodules, but her thyroid levels are low.  LMP was 05/15/20-05/19/20   Current Medication: Outpatient Encounter Medications as of 05/25/2020  Medication Sig  . acetaminophen (TYLENOL) 500 MG tablet Take 500 mg by mouth every 6 (six) hours as needed for mild pain or headache.  . cetirizine (ZYRTEC) 10 MG tablet Take 10 mg by mouth daily.  Marland Kitchen COVID-19 At Home Antigen Test [facility] COVID-19  RAPID TEST) KIT See admin instructions.  . drospirenone-ethinyl estradiol (YAZ) 3-0.02 MG tablet Take 1 tablet by mouth daily.  . DULoxetine (CYMBALTA) 60 MG capsule Take 60 mg by mouth daily.  . metaxalone (SKELAXIN) 800 MG tablet TAKE 1 TABLET(800 MG) BY MOUTH THREE TIMES DAILY AS NEEDED FOR MUSCLE SPASMS  . [DISCONTINUED] amphetamine-dextroamphetamine (ADDERALL) 15 MG tablet Take 15 mg by mouth in the morning, at noon, and at bedtime.  Marland Kitchen amphetamine-dextroamphetamine (ADDERALL XR) 20 MG 24 hr capsule Take 20 mg by mouth every morning.  Marland Kitchen amphetamine-dextroamphetamine (ADDERALL) 20 MG tablet Take 20 mg by mouth 2 (two) times daily.  . sulindac (CLINORIL) 200 MG tablet Take 200 mg by mouth 2 (two) times daily.   No facility-administered encounter medications on file as of 05/25/2020.    Surgical History: Past Surgical History:  Procedure Laterality Date  . WISDOM TOOTH EXTRACTION      Medical History: Past Medical History:  Diagnosis Date  . ADD (attention deficit disorder)   . Cholestasis during pregnancy   . Depression   . Vaginal Pap smear, abnormal     Family History: Family History  Problem Relation Age of Onset  . Hypothyroidism Mother   . COPD Maternal Grandmother        reactive airway disease    Social History   Socioeconomic History  . Marital status: Married    Spouse name: Not on file  . Number of children: Not  on file  . Years of education: Not on file  . Highest education level: Not on file  Occupational History  . Not on file  Tobacco Use  . Smoking status: Current Every Day Smoker    Types: Cigarettes  . Smokeless tobacco: Never Used  Substance and Sexual Activity  . Alcohol use: Yes    Alcohol/week: 0.0 standard drinks    Comment: Occasional  . Drug use: No  . Sexual activity: Yes    Birth control/protection: None  Other Topics Concern  . Not on file  Social History Narrative  . Not on file   Social Determinants of Health   Financial  Resource Strain: Not on file  Food Insecurity: Not on file  Transportation Needs: Not on file  Physical Activity: Not on file  Stress: Not on file  Social Connections: Not on file  Intimate Partner Violence: Not on file      Review of Systems  Constitutional: Positive for appetite change and fatigue. Negative for chills and fever.  Respiratory: Negative.   Cardiovascular: Negative.   Gastrointestinal: Negative.   Genitourinary: Negative.   Musculoskeletal: Negative.   Skin: Negative.   Neurological: Negative.   Psychiatric/Behavioral: Positive for behavioral problems, decreased concentration, dysphoric mood and sleep disturbance. The patient is nervous/anxious.     Vital Signs: BP 120/90   Pulse 91   Temp (!) 97.4 F (36.3 C)   Resp 16   Ht 5' 6"  (1.676 m)   Wt 174 lb 9.6 oz (79.2 kg)   SpO2 97%   BMI 28.18 kg/m    Physical Exam Vitals reviewed.  Constitutional:      Appearance: Normal appearance. She is well-developed and overweight.  HENT:     Head: Normocephalic and atraumatic.  Cardiovascular:     Rate and Rhythm: Normal rate and regular rhythm.     Pulses: Normal pulses.     Heart sounds: Normal heart sounds.  Pulmonary:     Effort: Pulmonary effort is normal.     Breath sounds: Normal breath sounds.  Skin:    General: Skin is warm and dry.     Capillary Refill: Capillary refill takes less than 2 seconds.  Neurological:     Mental Status: She is alert and oriented to person, place, and time.  Psychiatric:        Attention and Perception: Perception normal. She is inattentive.        Mood and Affect: Affect normal. Mood is depressed.        Speech: Speech normal.        Behavior: Behavior normal. Behavior is cooperative.        Thought Content: Thought content normal.        Cognition and Memory: Cognition and memory normal.        Judgment: Judgment normal.    Assessment/Plan: 1. Hypersomnia Periods of hypersomnia with a history of PMDD. Labs  ordered to assess hormone levels and CT head to rule out intracranial abnormalities. Recent labs done on 05/13/20 - CBC, prolactin, iron studies, B12, folate and vitamin D were all normal. Metabolic panel was normal except for CO2 was 19. Follow up in 2 weeks to discuss labs and test.  - FSH/LH - Testosterone,Free and Total - Progesterone - CT Head Wo Contrast; Future  2. Premenstrual dysphoric disorder History of PMDD managed with Yaz contraceptive pills. Symptoms severely worsen near the first day of her period and seem to coincide with the intense sleeping episodes.  3.  Multinodular goiter History of a multinodular goiter, US thyroid ordered. Thyroid panel was normal on 05/13/20. - US THYROID; Future   General Counseling: Crissy verbalizes understanding of the findings of todays visit and agrees with plan of treatment. I have discussed any further diagnostic evaluation that may be needed or ordered today. We also reviewed her medications today. she has been encouraged to call the office with any questions or concerns that should arise related to todays visit.    Orders Placed This Encounter  Procedures  . CT Head Wo Contrast  . US THYROID  . FSH/LH  . Testosterone,Free and Total  . Progesterone    No orders of the defined types were placed in this encounter.  Return in about 2 weeks (around 06/08/2020) for F/U, Review labs/test, Shelly Shoultz PCP.  Total time spent: 30 Minutes Time spent includes review of chart, medications, test results, and follow up plan with the patient.   [place] Controlled Substance Database was reviewed by me.  This patient was seen by Jonetta Osgood, FNP-C in collaboration with Dr. Clayborn Bigness as a part of collaborative care agreement.   Dr Lavera Guise Internal medicine

## 2020-06-04 ENCOUNTER — Telehealth: Payer: Self-pay

## 2020-06-04 ENCOUNTER — Other Ambulatory Visit: Payer: Self-pay | Admitting: Nurse Practitioner

## 2020-06-04 DIAGNOSIS — G4710 Hypersomnia, unspecified: Secondary | ICD-10-CM

## 2020-06-04 LAB — PROGESTERONE: Progesterone: 0.1 ng/mL

## 2020-06-04 LAB — FSH/LH
FSH: 5 m[IU]/mL
LH: 3.2 m[IU]/mL

## 2020-06-04 LAB — TESTOSTERONE,FREE AND TOTAL
Testosterone, Free: 0.5 pg/mL (ref 0.0–4.2)
Testosterone: 4 ng/dL — ABNORMAL LOW (ref 8–60)

## 2020-06-04 NOTE — Progress Notes (Signed)
Patient has extreme periods of sleepiness where it seems like she is in a coma for 2 to 4 days.  Her husband has called concerned because he cannot wake her up during these episodes.  She was seen at [facility] in the ER on 5/26 and MRI of the brain was done and it was negative for any abnormalities.  The MRA of the neck was also negative for abnormalities.  Her CBC was normal, metabolic panel was normal except for a slightly elevated chloride level of 108.  Troponin was negative, her testosterone level was low at 4, progesterone level in the luteal phase drawn at day 19 of her cycle was 0.1 which is low.  A venous blood gas was done in the ER on 5/26, and she had a slightly elevated bicarb at 28.  Her TSH level was 0.351.  She was tested for mono which was negative.  A carboxyhemoglobin level has been ordered to be drawn at [place] due to her symptoms being suspicious for possible exposure to low levels of carbon monoxide.  The discharge instructions from [facility] ER were for her to be referred to neurology.  History of PMDD, Anxiety, ADHD.   1. Hypersomnia Due to continued episodes, patient went to [facility] ER. Referral to neurology was recommended. Symptoms are suspicious for possible low level carbon monoxide exposure, lab ordered. - Carbon Monoxide, Blood - Ambulatory referral to Neurology

## 2020-06-04 NOTE — Telephone Encounter (Signed)
pt's husband called and was stating that pt still having severe drowsiness. went to ER at [facility] and they done labs and different test. hospt wanted pt to see a neurologist. I informed Alyssa of the issues

## 2020-06-08 ENCOUNTER — Ambulatory Visit: Payer: BC Managed Care – PPO | Admitting: Nurse Practitioner

## 2020-06-08 ENCOUNTER — Encounter: Payer: Self-pay | Admitting: Nurse Practitioner

## 2020-06-08 ENCOUNTER — Other Ambulatory Visit: Payer: Self-pay

## 2020-06-08 VITALS — BP 128/82 | HR 89 | Temp 99.0°F | Resp 16 | Ht 66.0 in | Wt 175.0 lb

## 2020-06-08 DIAGNOSIS — M722 Plantar fascial fibromatosis: Secondary | ICD-10-CM | POA: Diagnosis not present

## 2020-06-08 DIAGNOSIS — G4710 Hypersomnia, unspecified: Secondary | ICD-10-CM

## 2020-06-08 DIAGNOSIS — F3281 Premenstrual dysphoric disorder: Secondary | ICD-10-CM | POA: Diagnosis not present

## 2020-06-08 NOTE — Progress Notes (Signed)
[facility] [place], [place] [address]  Internal MEDICINE  Office Visit Note  Patient Name: Annette Lawson  [id number]  [id number]  Date of Service: 06/13/2020  Chief Complaint  Patient presents with  . Acute Visit    Drowsiness, 3 months right before cycle, review lab, med review     HPI Annette Lawson presents for an acute sick visit after an ER visit for these hypersomnia episodes she has started to have over the past 3 months. These episodes have been witnessed by her husband and family as well as her supervisor at work when an episode happened during her shift. 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 She has episodes of insomnia at night and then still gets up early to work during the day. She takes adderall for ADHD but is not taking it consistently. She was started on duloxetine in February 2022 for depression. Her diet is poor per the patient and her husbands report. She often eats fast food. She does not engage in any physical activity or exercise. She reports that she has no sex drive. She has upcoming ultrasound of her thyroid due to history of a nodule on her thyroid.  Need to cancel CT of head, she already had an MRI done in the ER recently. The MRI results were negative for any intracranial abnormality.    Current Medication:  Outpatient Encounter Medications as of 06/08/2020  Medication Sig  . acetaminophen (TYLENOL) 500 MG tablet Take 500 mg by mouth every 6 (six) hours as needed for mild pain or headache.  . amphetamine-dextroamphetamine (ADDERALL XR) 20 MG 24 hr capsule Take 20 mg by mouth every morning.  Marland Kitchen amphetamine-dextroamphetamine (ADDERALL) 20 MG tablet Take 20 mg by mouth 2 (two) times daily.  . cetirizine (ZYRTEC) 10 MG tablet Take 10 mg by mouth daily.  Marland Kitchen COVID-19 At Home Antigen Test [facility] COVID-19 RAPID TEST) KIT See admin instructions.  . drospirenone-ethinyl estradiol  (YAZ) 3-0.02 MG tablet Take 1 tablet by mouth daily.  . DULoxetine (CYMBALTA) 60 MG capsule Take 60 mg by mouth daily.  . metaxalone (SKELAXIN) 800 MG tablet TAKE 1 TABLET(800 MG) BY MOUTH THREE TIMES DAILY AS NEEDED FOR MUSCLE SPASMS  . sulindac (CLINORIL) 200 MG tablet Take 200 mg by mouth 2 (two) times daily.   No facility-administered encounter medications on file as of 06/08/2020.      Medical History: Past Medical History:  Diagnosis Date  . ADD (attention deficit disorder)   . Cholestasis during pregnancy   . Depression   . Vaginal Pap smear, abnormal      Vital Signs: BP 128/82   Pulse 89   Temp 99 F (37.2 C)   Resp 16   Ht 5' 6"  (1.676 m)   Wt 175 lb (79.4 kg)   SpO2 99%   BMI 28.25 kg/m    Review of Systems  Constitutional: Positive for fatigue. Negative for chills and fever.  Respiratory: Negative.   Cardiovascular: Negative.  Negative for chest pain.  Gastrointestinal: Negative.   Genitourinary: Negative.   Musculoskeletal: Negative.   Neurological: Positive for headaches.  Psychiatric/Behavioral: Positive for behavioral problems (depression) and sleep disturbance.    Physical Exam Vitals reviewed.  Constitutional:      General: She is not in acute distress.    Appearance: She is well-developed and overweight. She is ill-appearing. She is not diaphoretic.  HENT:     Head: Normocephalic  and atraumatic.  Neck:     Thyroid: No thyromegaly.     Vascular: No JVD.     Trachea: No tracheal deviation.  Cardiovascular:     Rate and Rhythm: Normal rate and regular rhythm.     Pulses: Normal pulses.     Heart sounds: Normal heart sounds. No murmur heard. No friction rub. No gallop.   Pulmonary:     Effort: Pulmonary effort is normal. No respiratory distress.     Breath sounds: Normal breath sounds. No wheezing or rales.  Chest:     Chest wall: No tenderness.  Abdominal:     General: Bowel sounds are normal.     Palpations: Abdomen is soft.   Musculoskeletal:        General: Normal range of motion.     Cervical back: Normal range of motion and neck supple.  Lymphadenopathy:     Cervical: No cervical adenopathy.  Skin:    General: Skin is warm and dry.     Capillary Refill: Capillary refill takes less than 2 seconds.  Neurological:     Mental Status: She is alert and oriented to person, place, and time.  Psychiatric:        Behavior: Behavior normal.        Thought Content: Thought content normal.        Judgment: Judgment normal.       Assessment/Plan: 1. Hypersomnia Consult with Dr. Devona Konig for sleep medicine, she has an appointment scheduled for 06/21/20 with Dr. Humphrey Rolls. Sleep study, EEG, and sleep deprived EEG ordered.  - PSG Sleep Study; Future - EEG adult; Future - Adult sleep deprived EEG; Future  2. Premenstrual dysphoric disorder Still taking Yaz to control symptoms.   3. Depression Taking duloxetine, since February 2022.   4. Plantar fibromatosis Lump on the sole of the foot. Possible plantar fibroma, referred to podiatry.  - Ambulatory referral to Podiatry   General Counseling: [name] verbalizes understanding of the findings of todays visit and agrees with plan of treatment. I have discussed any further diagnostic evaluation that may be needed or ordered today. We also reviewed her medications today. she has been encouraged to call the office with any questions or concerns that should arise related to todays visit.    Counseling:    Orders Placed This Encounter  Procedures  . Ambulatory referral to Podiatry  . EEG adult  . Adult sleep deprived EEG  . PSG Sleep Study    No orders of the defined types were placed in this encounter.  Return in about 1 week (around 06/15/2020) for pulmonary/sleep consult with DSK at earliest opening, then follow up with [place] PCP in 4 weeks.  [place] Controlled Substance Database was reviewed by me.  Time spent:30 Minutes  This patient was seen by Jonetta Osgood, FNP-C in collaboration with Dr. Clayborn Bigness as a part of collaborative care agreement.  Octaviano Glow, MSN, FNP-C Internal Medicine

## 2020-06-16 ENCOUNTER — Ambulatory Visit: Admission: RE | Admit: 2020-06-16 | Payer: BC Managed Care – PPO | Source: Ambulatory Visit

## 2020-06-17 ENCOUNTER — Telehealth: Payer: Self-pay

## 2020-06-17 NOTE — Telephone Encounter (Signed)
Faxed referral to [facility] at [phone]. Annette Lawson

## 2020-06-21 ENCOUNTER — Ambulatory Visit: Payer: BC Managed Care – PPO | Admitting: Internal Medicine

## 2020-06-23 ENCOUNTER — Telehealth: Payer: Self-pay

## 2020-06-23 NOTE — Telephone Encounter (Signed)
Patient called asking for an update on her FMLA paperwork. I did ask patient who she gave the paperwork to as I did not have it. She stated she gave it to [place]. I did confirm with Alyssa she does have the paperwork and it is not yet complete. Will let patient know. Loma Sousa

## 2020-06-24 ENCOUNTER — Encounter: Payer: Self-pay | Admitting: Nurse Practitioner

## 2020-06-24 ENCOUNTER — Telehealth: Payer: Self-pay

## 2020-06-24 NOTE — Telephone Encounter (Signed)
Patient's FMLA paperwork complete by provider and faxed to [employer] at [phone]. Original ready for patient at front desk.Patient was advised and copy was placed in scan. Loma Sousa

## 2020-06-29 ENCOUNTER — Telehealth: Payer: Self-pay

## 2020-06-29 NOTE — Telephone Encounter (Signed)
Left vm for patient to return my call regarding [facility] Neurology referral-Toni

## 2020-07-05 ENCOUNTER — Telehealth: Payer: Self-pay

## 2020-07-05 NOTE — Telephone Encounter (Signed)
Left vm to screen for 07/06/20 appointment-Toni

## 2020-07-06 ENCOUNTER — Encounter: Payer: Self-pay | Admitting: Nurse Practitioner

## 2020-07-06 ENCOUNTER — Ambulatory Visit: Payer: BC Managed Care – PPO | Admitting: Internal Medicine

## 2020-07-06 ENCOUNTER — Ambulatory Visit: Payer: BC Managed Care – PPO | Admitting: Nurse Practitioner

## 2020-07-13 ENCOUNTER — Telehealth: Payer: Self-pay

## 2020-07-13 ENCOUNTER — Ambulatory Visit: Payer: BC Managed Care – PPO | Admitting: Obstetrics and Gynecology

## 2020-07-13 NOTE — Telephone Encounter (Signed)
Left vm to screen for 07/14/20 appointment-Toni

## 2020-07-14 ENCOUNTER — Other Ambulatory Visit: Payer: BC Managed Care – PPO

## 2020-07-21 ENCOUNTER — Ambulatory Visit: Payer: BC Managed Care – PPO | Admitting: Nurse Practitioner

## 2020-07-21 ENCOUNTER — Telehealth: Payer: Self-pay

## 2020-07-21 NOTE — Telephone Encounter (Signed)
Spoke with patient in reagrds to Neurology referral placed in May. Referred to office tried to reach patient to schedule an appointment on several attempts and patient has not returned their calls. I tried contacting patient this morning and was able to speak to the patient and asked her if she was still interested in the referral. I also asked her if she wanted to reschedule her missed appointments and she advised me she needed to find a [facility] affiliated provider as this is where her insurance is through because it will be more affordable to her and advised she is no longer seeing [place] for her healthcare. Annette Lawson

## 2020-08-25 ENCOUNTER — Other Ambulatory Visit: Payer: Self-pay | Admitting: Nurse Practitioner

## 2020-08-25 DIAGNOSIS — E042 Nontoxic multinodular goiter: Secondary | ICD-10-CM

## 2020-12-30 ENCOUNTER — Encounter: Payer: Self-pay | Admitting: Nurse Practitioner

## 2022-01-29 IMAGING — US US EXTREM LOW VENOUS*R*
1 series · 13 of 24 positions shown · non-contrast
Comparison: None.

CLINICAL DATA: Lower extremity pain and edema

EXAM:
RIGHT LOWER EXTREMITY VENOUS DUPLEX ULTRASOUND
TECHNIQUE: Gray-scale sonography with graded compression, as well as color
Doppler and duplex ultrasound were performed to evaluate the right
lower extremity deep venous system from the level of the common
femoral vein and including the common femoral, femoral, profunda
femoral, popliteal and calf veins including the posterior tibial,
peroneal and gastrocnemius veins when visible. The superficial great
saphenous vein was also interrogated. Spectral Doppler was utilized
to evaluate flow at rest and with distal augmentation maneuvers in
the common femoral, femoral and popliteal veins.

[Series 1: us venous img lower uni right (dvt) · portal-venous · 13 of 27 slices shown]
[im 1/27]
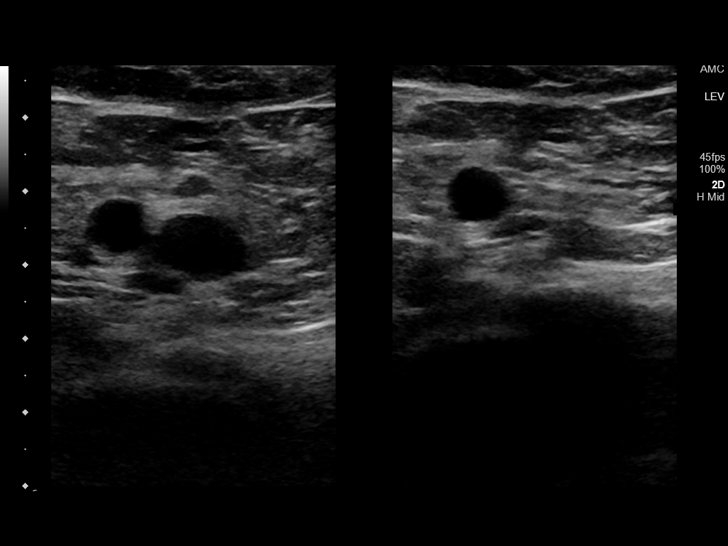
[im 3/27]
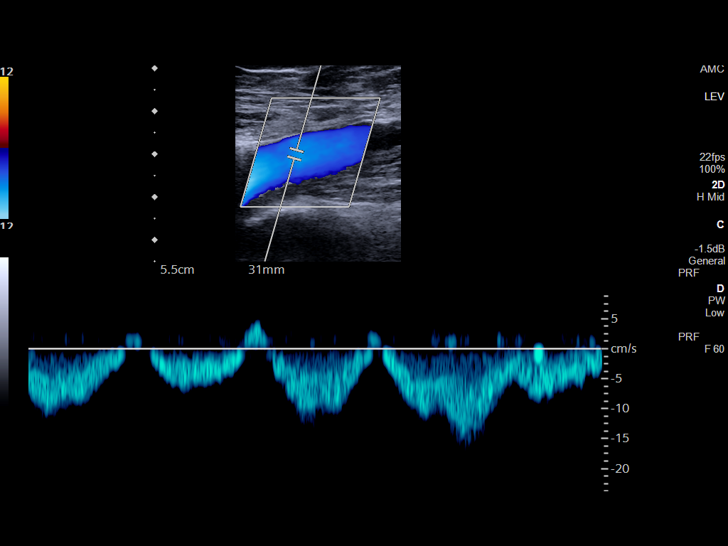
[im 5/27]
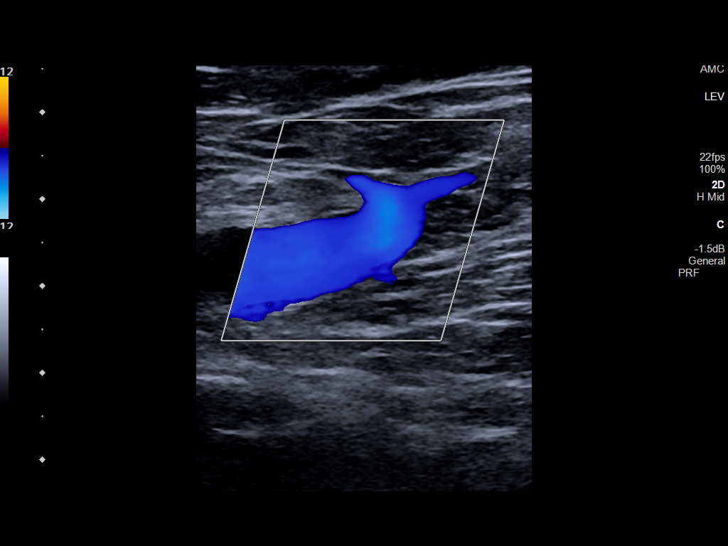
[im 7/27]
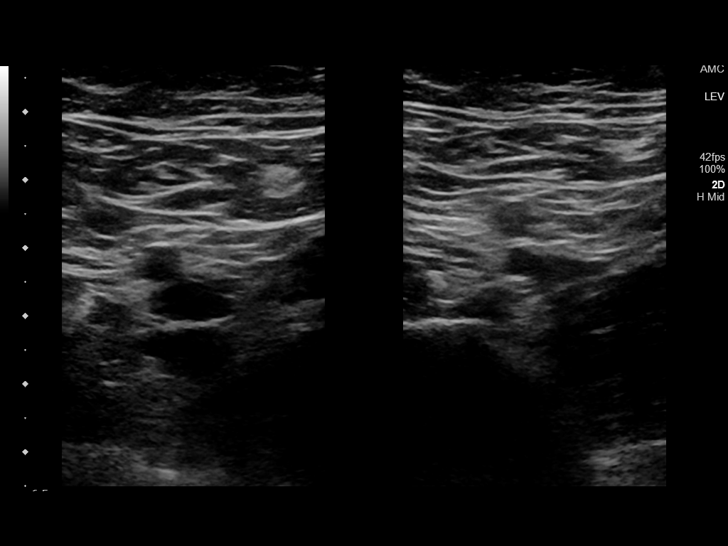
[im 10/27]
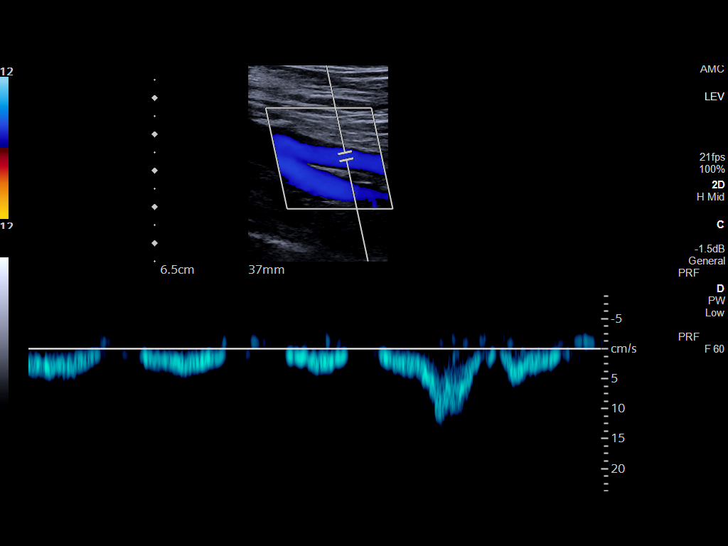
[im 12/27]
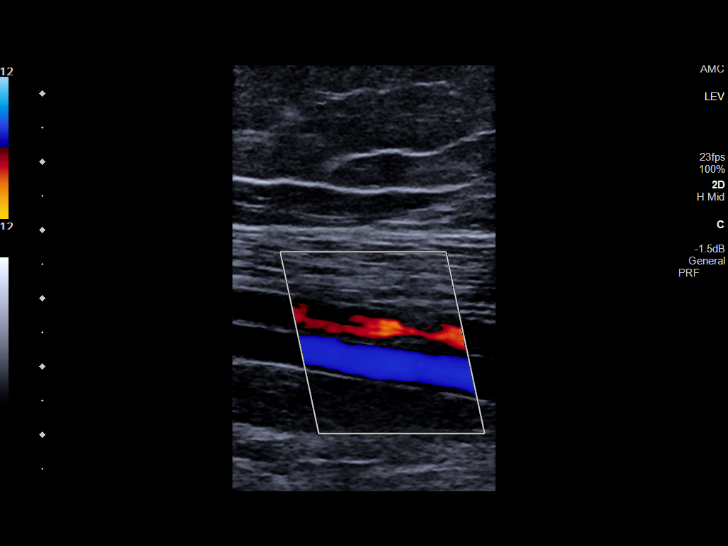
[im 14/27]
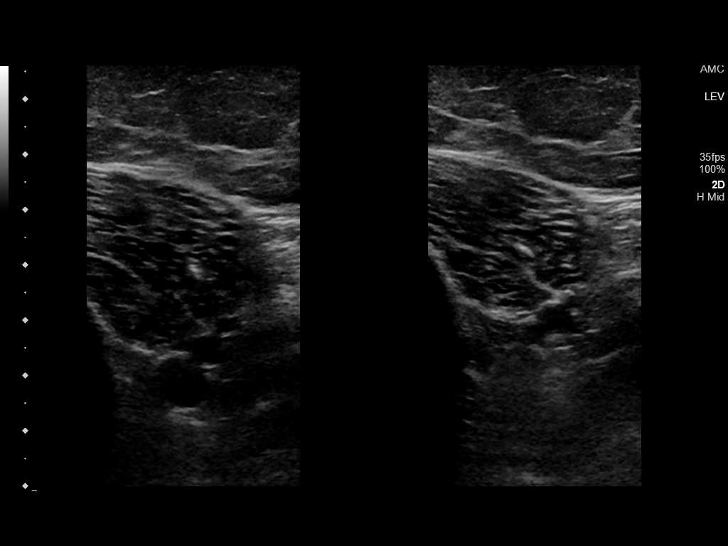
[im 15/27]
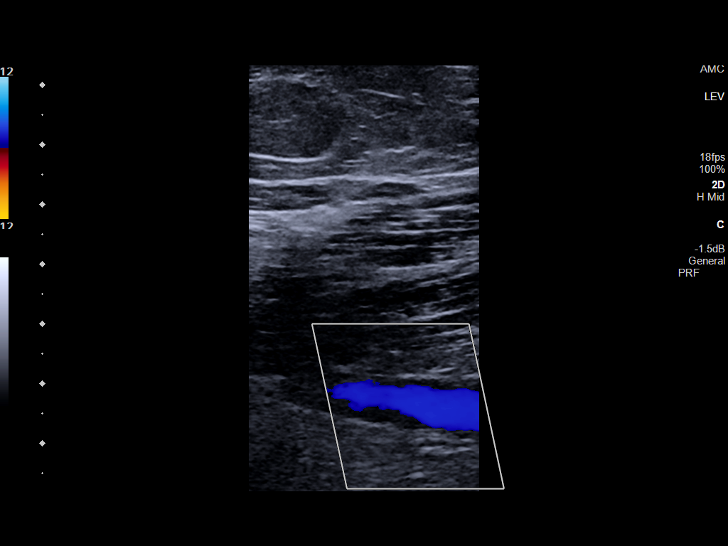
[im 17/27]
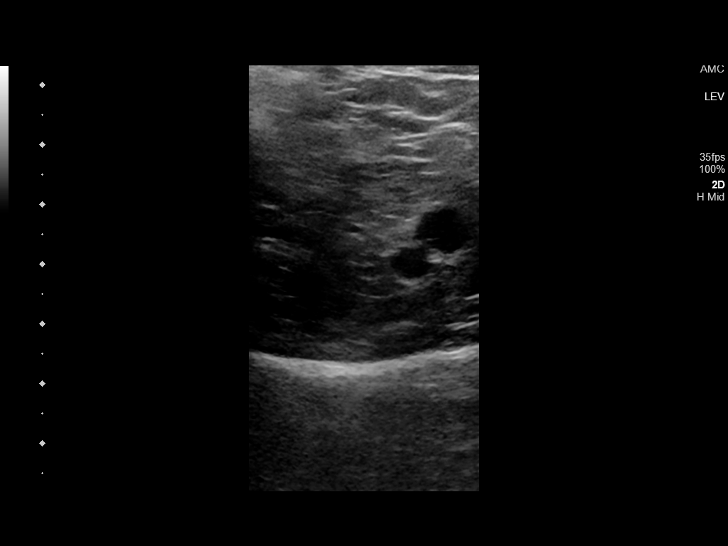
[im 20/27]
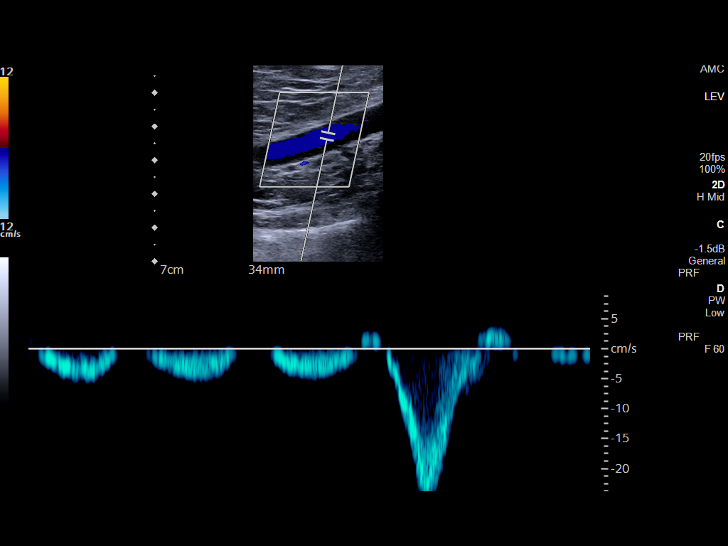
[im 22/27]
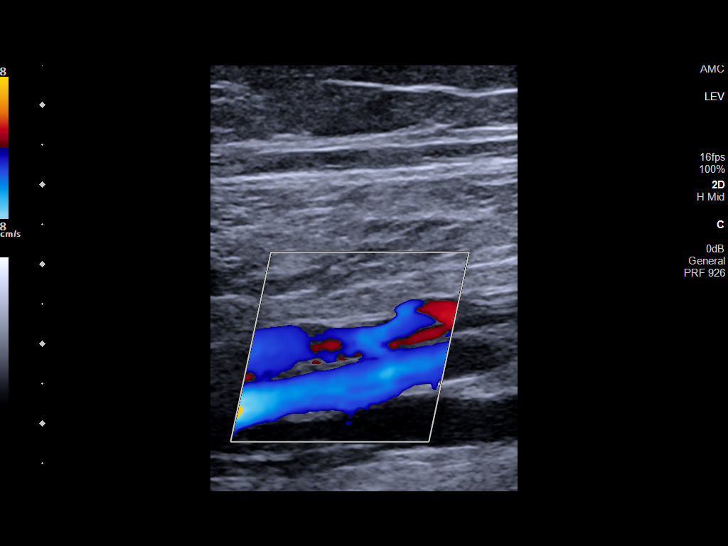
[im 24/27]
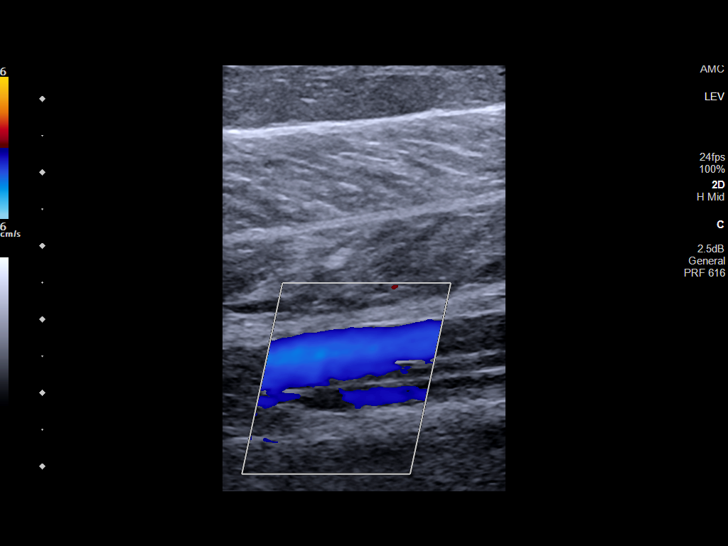
[im 27/27]
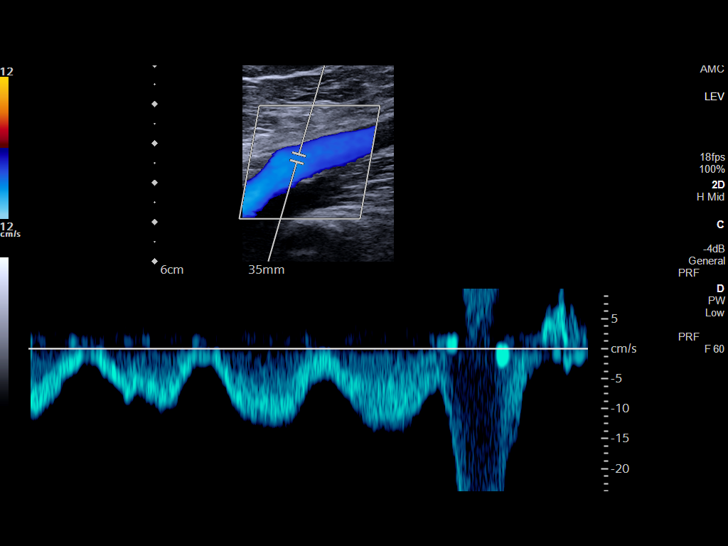

[13 of 24 positions shown; findings below may reference images not displayed]

FINDINGS: Contralateral Common Femoral Vein: Respiratory phasicity is normal
and symmetric with the symptomatic side. No evidence of thrombus.
Normal compressibility.

Common Femoral Vein: No evidence of thrombus. Normal
compressibility, respiratory phasicity and response to augmentation.

Saphenofemoral Junction: No evidence of thrombus. Normal
compressibility and flow on color Doppler imaging.

Profunda Femoral Vein: No evidence of thrombus. Normal
compressibility and flow on color Doppler imaging.

Femoral Vein: No evidence of thrombus. Normal compressibility,
respiratory phasicity and response to augmentation.

Popliteal Vein: No evidence of thrombus. Normal compressibility,
respiratory phasicity and response to augmentation.

Calf Veins: No evidence of thrombus. Normal compressibility and flow
on color Doppler imaging.

Superficial Great Saphenous Vein: No evidence of thrombus. Normal
compressibility.

Venous Reflux:  None.

Other Findings:  None.
IMPRESSION: No evidence of deep venous thrombosis in the right lower extremity.
Left common femoral vein also patent.

## 2022-12-19 DIAGNOSIS — F900 Attention-deficit hyperactivity disorder, predominantly inattentive type: Secondary | ICD-10-CM | POA: Diagnosis not present

## 2022-12-19 DIAGNOSIS — F331 Major depressive disorder, recurrent, moderate: Secondary | ICD-10-CM | POA: Diagnosis not present

## 2023-01-09 ENCOUNTER — Encounter: Payer: Self-pay | Admitting: Emergency Medicine

## 2023-01-09 ENCOUNTER — Other Ambulatory Visit: Payer: Self-pay

## 2023-01-09 ENCOUNTER — Ambulatory Visit: Admission: EM | Admit: 2023-01-09 | Discharge: 2023-01-09 | Payer: 59

## 2023-01-09 DIAGNOSIS — R112 Nausea with vomiting, unspecified: Secondary | ICD-10-CM | POA: Diagnosis not present

## 2023-01-09 DIAGNOSIS — R1031 Right lower quadrant pain: Secondary | ICD-10-CM | POA: Diagnosis not present

## 2023-01-09 DIAGNOSIS — K3580 Unspecified acute appendicitis: Secondary | ICD-10-CM | POA: Diagnosis not present

## 2023-01-09 NOTE — ED Triage Notes (Signed)
 Last bm was 2 days ago, has had thoughts that she may be constipated, but this time feels different

## 2023-01-09 NOTE — ED Provider Notes (Signed)
 [facility]    CSN: [id number] Arrival date & time: 01/09/23  1022      History   Chief Complaint Chief Complaint  Patient presents with   Abdominal Pain    HPI Annette Lawson is a 37 y.o. female presenting for 2-day history of severe right lower quadrant abdominal pain.  Patient reports the pain was the worst yesterday and it caused her to have an episode of vomiting.  She says when she had severe pain yesterday she had a gush of blood come out vaginally.  Denies fever but has been taking ibuprofen  around-the-clock.  Last dose of ibuprofen  was about an hour and a half ago and current temperature is 98.4 degrees.  Reports significant discomfort with any movement.  Has had loss of appetite.  Last bowel movement was a couple of days ago.  She denies dysuria, frequency, urgency, vaginal discharge or odor.  No flank pain.  Medical history significant for ADD, depression, PMDD.  Also has history of cholestasis during pregnancy.  LMP 12/18/2022.  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   Raynaud's phenomenon without gangrene 05/03/2019   Other fatigue 05/03/2019   Normal labor 06/28/2018   GBS (group B Streptococcus carrier), +RV culture, currently pregnant 06/17/2018   Back pain affecting pregnancy 06/11/2018   Supervision of high risk pregnancy, antepartum 11/20/2017   Rh negative state in antepartum period 11/20/2017   History of cholestasis during pregnancy 11/20/2017   Depression affecting pregnancy 11/20/2017   History of postpartum hemorrhage, currently pregnant 11/20/2017   Multinodular goiter 07/02/2017   Vitamin D  deficiency 02/07/2017   Postpartum care  following vaginal delivery (11/22) 12/01/2015   ADD (attention deficit disorder) 02/22/2015    Past Surgical History:  Procedure Laterality Date   WISDOM TOOTH EXTRACTION      OB History     Gravida  2   Para  2   Term  2   Preterm      AB      Living  2      SAB      IAB      Ectopic      Multiple  0   Live Births  2            Home Medications    Prior to Admission medications   Medication Sig Start Date End Date Taking? Authorizing Provider  amphetamine -dextroamphetamine  (ADDERALL XR) 20 MG 24 hr capsule Take 1 capsule by mouth every morning. 05/05/21  Yes [provider]  fexofenadine (ALLEGRA) 180 MG tablet Take by mouth. 11/01/21  Yes [provider]  acetaminophen  (TYLENOL ) 500 MG tablet Take 500 mg by mouth every 6 (six) hours as needed for mild pain or headache.    [provider]  amphetamine -dextroamphetamine  (ADDERALL XR) 20 MG 24 hr capsule Take 20 mg by mouth every morning. 05/19/20   [provider]  amphetamine -dextroamphetamine  (ADDERALL) 20 MG tablet Take 20 mg by mouth 2 (two) times daily. 04/23/20   [provider]  cetirizine (ZYRTEC) 10 MG tablet Take 10 mg by mouth daily. Patient not taking: Reported on 01/09/2023    [provider]  COVID-19 At Home Antigen Test [facility] COVID-19 RAPID TEST) KIT See admin instructions. 05/26/20   [provider]  drospirenone -ethinyl estradiol  (YAZ) 3-0.02 MG tablet Take 1 tablet by mouth daily. Patient not taking: Reported on 01/09/2023 03/30/20   Leonce Garnette BIRCH, MD  DULoxetine (CYMBALTA) 60 MG capsule Take 60 mg by mouth daily. Patient not taking: Reported on 01/09/2023    [provider]  metaxalone  (SKELAXIN ) 800 MG tablet TAKE 1 TABLET(800 MG) BY MOUTH THREE TIMES DAILY AS NEEDED FOR MUSCLE SPASMS 03/17/20   Leonce Garnette BIRCH, MD  sulindac (CLINORIL) 200 MG tablet Take 200 mg by mouth 2 (two) times daily. Patient not taking:  Reported on 01/09/2023 05/12/20   [provider]    Family History Family History  Problem Relation Age of Onset   Hypothyroidism Mother    COPD Maternal Grandmother        reactive airway disease    Social History Social History   Tobacco Use   Smoking status: Every Day    Types: Cigarettes   Smokeless tobacco: Never  Vaping Use   Vaping status: Never Used  Substance Use Topics   Alcohol use: Yes    Alcohol/week: 0.0 standard drinks of alcohol    Comment: Occasional   Drug use: No     Allergies   Cephalexin    Review of Systems Review of Systems  Constitutional:  Positive for appetite change and fatigue. Negative for fever.  Respiratory:  Negative for shortness of breath.   Cardiovascular:  Negative for chest pain.  Gastrointestinal:  Positive for abdominal pain, nausea and vomiting. Negative for constipation and diarrhea.  Genitourinary:  Positive for pelvic pain. Negative for difficulty urinating, dysuria, flank pain, frequency, hematuria and vaginal discharge.  Musculoskeletal:  Negative for back pain.  Neurological:  Negative for dizziness, weakness and headaches.     Physical Exam Triage Vital Signs ED Triage Vitals  Encounter Vitals Group     BP 01/09/23 1127 116/84     Systolic BP Percentile --      Diastolic BP Percentile --      Pulse Rate 01/09/23 1127 86     Resp 01/09/23 1127 18     Temp 01/09/23 1127 98.4 F (36.9 C)     Temp Source 01/09/23 1127 Oral     SpO2 01/09/23 1127 99 %     Weight --      Height --      Head Circumference --      Peak Flow --      Pain Score 01/09/23 1121 4     Pain Loc --      Pain Education --      Exclude from Growth Chart --    No data found.  Updated Vital Signs BP 116/84 (BP Location: Left Arm)   Pulse 86   Temp 98.4 F (36.9 C) (Oral)   Resp 18   LMP 12/18/2022   SpO2 99%    Physical Exam Vitals and nursing note reviewed.  Constitutional:      General: She is not in acute distress.     Appearance: Normal appearance. She is not ill-appearing or toxic-appearing.     Comments: Grasping lower abdomen with movement.  Appears to be in pain.  Wincing in pain.  HENT:     Head: Normocephalic and atraumatic.     Nose: Nose normal.     Mouth/Throat:     Mouth: Mucous membranes are moist.     Pharynx:  Oropharynx is clear.  Eyes:     General: No scleral icterus.       Right eye: No discharge.        Left eye: No discharge.     Conjunctiva/sclera: Conjunctivae normal.  Cardiovascular:     Rate and Rhythm: Normal rate and regular rhythm.     Heart sounds: Normal heart sounds.  Pulmonary:     Effort: Pulmonary effort is normal. No respiratory distress.     Breath sounds: Normal breath sounds.  Abdominal:     Palpations: Abdomen is soft.     Tenderness: There is abdominal tenderness (RLQ). There is guarding (RLQ). There is no right CVA tenderness or left CVA tenderness.     Comments: TTP at [place]'s. +Rosving sign. Increased pain with heel tap when lying down.  Musculoskeletal:     Cervical back: Neck supple.  Skin:    General: Skin is dry.  Neurological:     General: No focal deficit present.     Mental Status: She is alert. Mental status is at baseline.     Motor: No weakness.     Gait: Gait normal.  Psychiatric:        Mood and Affect: Mood normal.        Behavior: Behavior normal.      UC Treatments / Results  Labs (all labs ordered are listed, but only abnormal results are displayed) Labs Reviewed - No data to display  EKG   Radiology No results found.  Procedures Procedures (including critical care time)  Medications Ordered in UC Medications - No data to display  Initial Impression / Assessment and Plan / UC Course  I have reviewed the triage vital signs and the nursing notes.  Pertinent labs & imaging results that were available during my care of the patient were reviewed by me and considered in my medical decision making (see chart for  details).   37 year old female presents for 2-day history of severe right lower quadrant abdominal pain.  Had nausea and vomiting yesterday and also a gush of blood vaginally.  Denies fever but has been taking antipyretics around-the-clock.  States Motrin  helps a little bit with the pain.  Denies change in bowel habits.  No urinary symptoms.  Vitals are all normal and stable.  Appears to be very uncomfortable and in pain especially with changing positions.  Holding onto her right lower abdomen.  Abdomen is soft but she has tenderness of the right lower quadrant and positive Rovsing's sign.  Negative psoas and obturator.  Increased discomfort with heeltap.  Explained to patient my concerns for appendicitis or possible ovarian cyst rupture.  Advised further emergent evaluation in the ED at this time.  Patient elects to go to [facility] emergency department for further evaluation.  She declines EMS transport.  Is aware that if she goes to [facility] and does indeed have appendicitis she may be transferred elsewhere.  She says she does not want to go to [facility] due to wait times.  Patient leaving in stable condition and route to [facility] ED [place].  Acute illness with concern for possible appendicitis.  Condition with potential threat to life/bodily harm.  Final Clinical Impressions(s) / UC Diagnoses   Final diagnoses:  Right lower quadrant pain  Nausea and vomiting, unspecified vomiting type     Discharge Instructions      -I am concerned about possible appendicitis or an ovarian cyst.  Please go to the ER immediately.  Knee imaging to  be performed.  You have been advised to follow up immediately in the emergency department for concerning signs.symptoms. If you declined EMS transport, please have a family member take you directly to the ED at this time. Do not delay. Based on concerns about condition, if you do not follow up in th e ED, you may risk poor outcomes including worsening of  condition, delayed treatment and potentially life threatening issues. If you have declined to go to the ED at this time, you should call your PCP immediately to set up a follow up appointment.  Go to ED for red flag symptoms, including; fevers you cannot reduce with Tylenol /Motrin , severe headaches, vision changes, numbness/weakness in part of the body, lethargy, confusion, intractable vomiting, severe dehydration, chest pain, breathing difficulty, severe persistent abdominal or pelvic pain, signs of severe infection (increased redness, swelling of an area), feeling faint or passing out, dizziness, etc. You should especially go to the ED for sudden acute worsening of condition if you do not elect to go at this time.     ED Prescriptions   None    PDMP not reviewed this encounter.   Arvis Jolan NOVAK, PA-C 01/09/23 1202

## 2023-01-09 NOTE — Discharge Instructions (Addendum)
-  I am concerned about possible appendicitis or an ovarian cyst.  Please go to the ER immediately.  Knee imaging to be performed.  You have been advised to follow up immediately in the emergency department for concerning signs.symptoms. If you declined EMS transport, please have a family member take you directly to the ED at this time. Do not delay. Based on concerns about condition, if you do not follow up in th e ED, you may risk poor outcomes including worsening of condition, delayed treatment and potentially life threatening issues. If you have declined to go to the ED at this time, you should call your PCP immediately to set up a follow up appointment.  Go to ED for red flag symptoms, including; fevers you cannot reduce with Tylenol /Motrin , severe headaches, vision changes, numbness/weakness in part of the body, lethargy, confusion, intractable vomiting, severe dehydration, chest pain, breathing difficulty, severe persistent abdominal or pelvic pain, signs of severe infection (increased redness, swelling of an area), feeling faint or passing out, dizziness, etc. You should especially go to the ED for sudden acute worsening of condition if you do not elect to go at this time.

## 2023-01-09 NOTE — ED Triage Notes (Signed)
 Symptoms started Sunday evening-12/29.  Initially pain in right lower abdomen, now pain involves more of the right side of abdomen.  Vomited yesterday morning when pain is described as being the most intense.  Denies diarrhea.  Denies pain in urination.  Fewer episodes of urinating and urine is darker.  Patient has taken ibuprofen .  Took last dose of ibuprofen  around 10:15 am this morning

## 2023-01-09 NOTE — ED Notes (Signed)
 Patient is being discharged from the Urgent Care and sent to the Emergency Department via [facility] . Per Arvis Jolan NOVAK, PA-C , patient is in need of higher level of care due to RLQ pain. Patient is aware and verbalizes understanding of plan of care.  Vitals:   01/09/23 1127  BP: 116/84  Pulse: 86  Resp: 18  Temp: 98.4 F (36.9 C)  SpO2: 99%

## 2023-01-10 DIAGNOSIS — K353 Acute appendicitis with localized peritonitis, without perforation or gangrene: Secondary | ICD-10-CM | POA: Diagnosis not present

## 2023-01-10 DIAGNOSIS — K3580 Unspecified acute appendicitis: Secondary | ICD-10-CM | POA: Diagnosis not present

## 2023-03-14 DIAGNOSIS — F331 Major depressive disorder, recurrent, moderate: Secondary | ICD-10-CM | POA: Diagnosis not present

## 2023-03-14 DIAGNOSIS — F900 Attention-deficit hyperactivity disorder, predominantly inattentive type: Secondary | ICD-10-CM | POA: Diagnosis not present

## 2023-05-10 DIAGNOSIS — F331 Major depressive disorder, recurrent, moderate: Secondary | ICD-10-CM | POA: Diagnosis not present

## 2023-05-10 DIAGNOSIS — F900 Attention-deficit hyperactivity disorder, predominantly inattentive type: Secondary | ICD-10-CM | POA: Diagnosis not present

## 2023-08-19 ENCOUNTER — Other Ambulatory Visit: Payer: Self-pay | Admitting: Medical Genetics

## 2023-08-25 ENCOUNTER — Other Ambulatory Visit: Payer: Self-pay

## 2023-09-01 ENCOUNTER — Other Ambulatory Visit
Admission: RE | Admit: 2023-09-01 | Discharge: 2023-09-01 | Disposition: A | Discharge status: Home / Self Care | Payer: Self-pay | Source: Ambulatory Visit | Attending: Medical Genetics | Admitting: Medical Genetics

## 2023-09-16 LAB — GENECONNECT MOLECULAR SCREEN: Genetic Analysis Overall Interpretation: NEGATIVE
